# Patient Record
Sex: Female | Born: 1980 | Race: White | Hispanic: Yes | Marital: Single | Smoking: Never smoker
Health system: Southern US, Community
[De-identification: ages and names within clinical notes are randomized; demographics above are authoritative.]

## PROBLEM LIST (undated history)

## (undated) ENCOUNTER — Inpatient Hospital Stay (HOSPITAL_COMMUNITY): Payer: MEDICAID

---

## 2003-03-05 ENCOUNTER — Inpatient Hospital Stay (HOSPITAL_COMMUNITY): Admission: AD | Admit: 2003-03-05 | Discharge: 2003-03-06 | Payer: Self-pay | Admitting: Obstetrics & Gynecology

## 2003-04-09 ENCOUNTER — Inpatient Hospital Stay (HOSPITAL_COMMUNITY): Admission: AD | Admit: 2003-04-09 | Discharge: 2003-04-09 | Payer: Self-pay | Admitting: Obstetrics & Gynecology

## 2003-04-13 ENCOUNTER — Encounter: Admission: RE | Admit: 2003-04-13 | Discharge: 2003-04-13 | Payer: Self-pay | Admitting: *Deleted

## 2003-04-16 ENCOUNTER — Encounter: Admission: RE | Admit: 2003-04-16 | Discharge: 2003-04-16 | Payer: Self-pay | Admitting: *Deleted

## 2003-04-16 ENCOUNTER — Inpatient Hospital Stay (HOSPITAL_COMMUNITY): Admission: AD | Admit: 2003-04-16 | Discharge: 2003-04-16 | Payer: Self-pay | Admitting: *Deleted

## 2003-04-20 ENCOUNTER — Encounter: Admission: RE | Admit: 2003-04-20 | Discharge: 2003-04-20 | Payer: Self-pay | Admitting: *Deleted

## 2003-04-23 ENCOUNTER — Encounter: Admission: RE | Admit: 2003-04-23 | Discharge: 2003-04-23 | Payer: Self-pay | Admitting: *Deleted

## 2003-04-30 ENCOUNTER — Encounter: Admission: RE | Admit: 2003-04-30 | Discharge: 2003-04-30 | Payer: Self-pay | Admitting: *Deleted

## 2003-05-07 ENCOUNTER — Encounter: Admission: RE | Admit: 2003-05-07 | Discharge: 2003-05-07 | Payer: Self-pay | Admitting: Family Medicine

## 2003-05-08 ENCOUNTER — Inpatient Hospital Stay (HOSPITAL_COMMUNITY): Admission: AD | Admit: 2003-05-08 | Discharge: 2003-05-10 | Payer: Self-pay | Admitting: Specialist

## 2003-05-14 ENCOUNTER — Encounter: Admission: RE | Admit: 2003-05-14 | Discharge: 2003-05-14 | Payer: Self-pay | Admitting: *Deleted

## 2004-06-25 ENCOUNTER — Inpatient Hospital Stay (HOSPITAL_COMMUNITY): Admission: AD | Admit: 2004-06-25 | Discharge: 2004-06-26 | Payer: Self-pay | Admitting: *Deleted

## 2004-06-25 IMAGING — US US OB COMP LESS 14 WK
1 series · 18 of 28 positions shown · non-contrast
Comparison: none

CLINICAL DATA: 23-year-old pregnant female with vaginal bleeding and cramping.

[Series 1: us ob comp<14 wk · 18 of 48 slices shown]
[im 1/48]
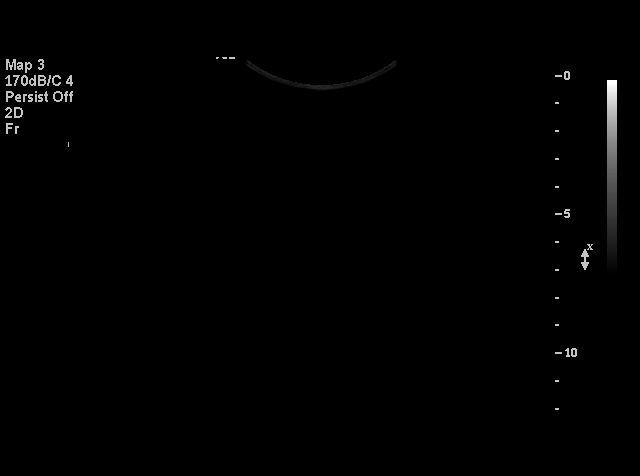
[im 4/48]
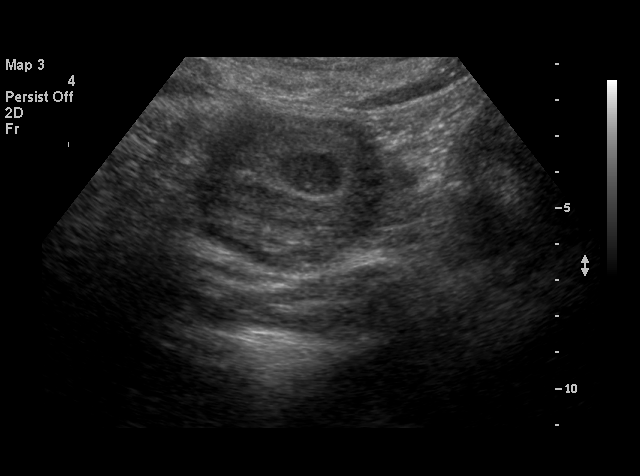
[im 6/48]
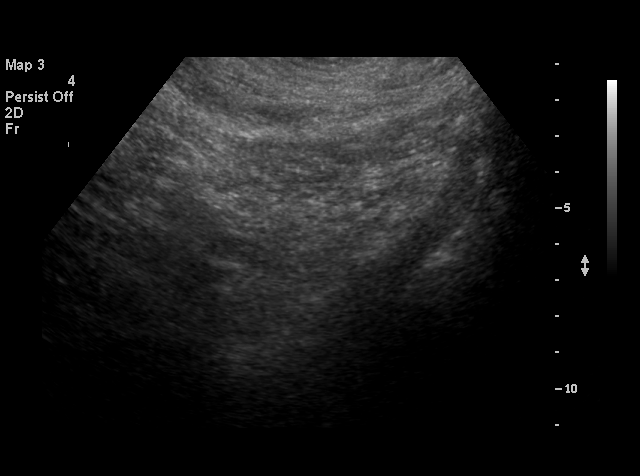
[im 9/48]
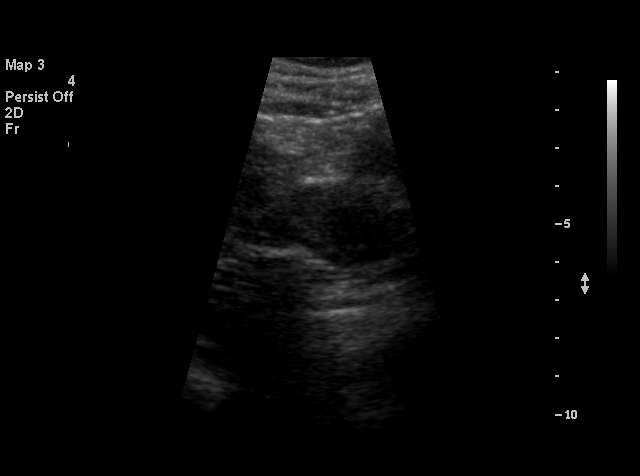
[im 13/48]
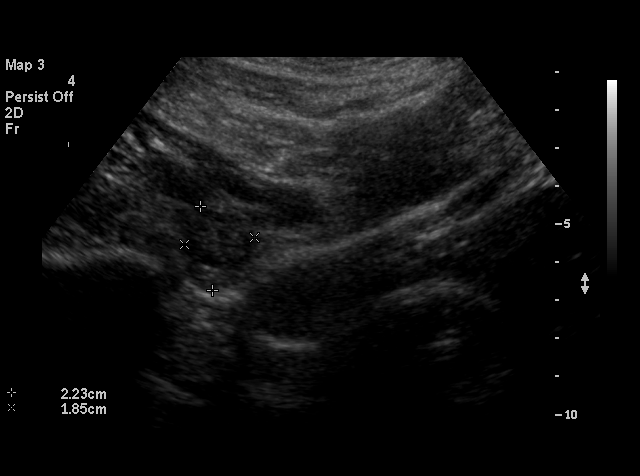
[im 14/48]
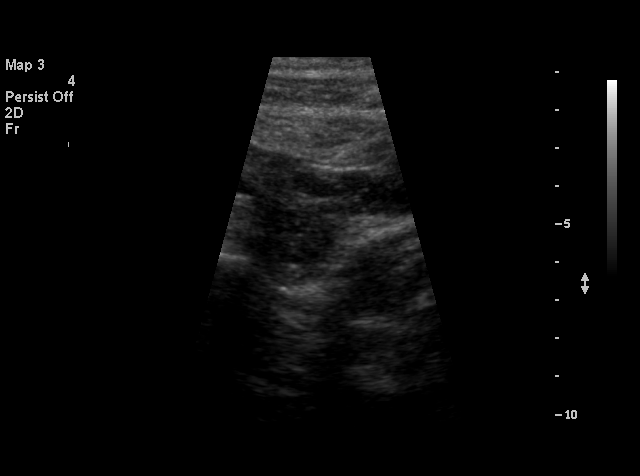
[im 18/48]
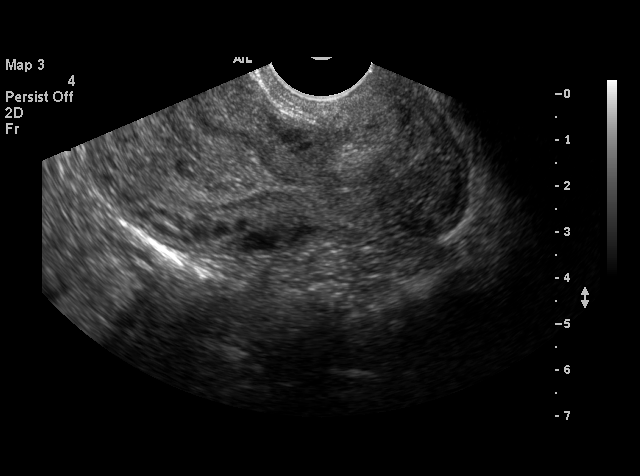
[im 20/48]
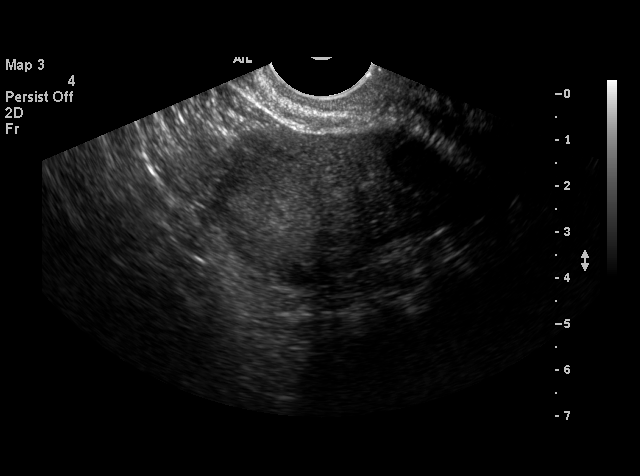
[im 23/48]
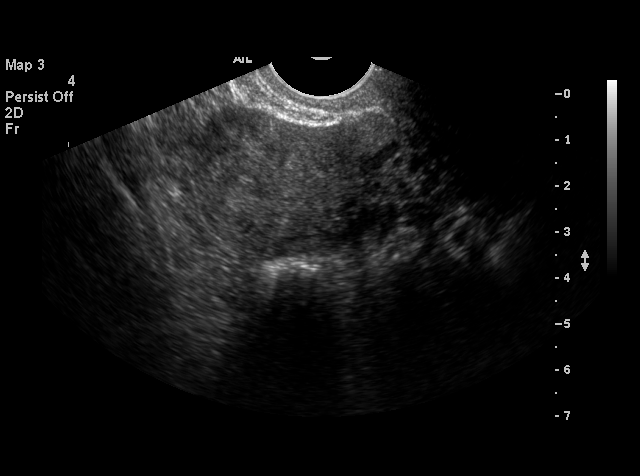
[im 25/48]
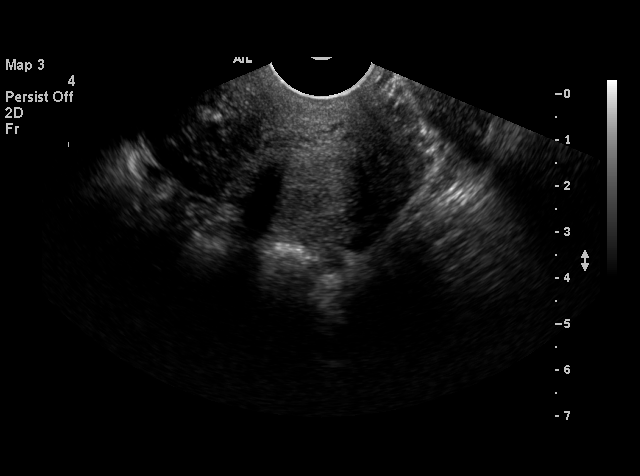
[im 28/48]
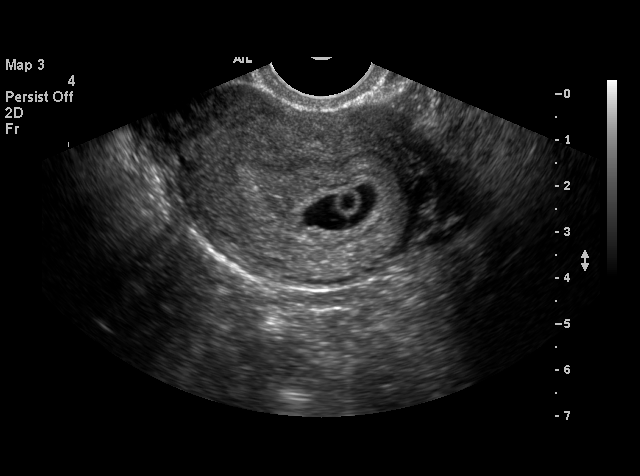
[im 30/48]
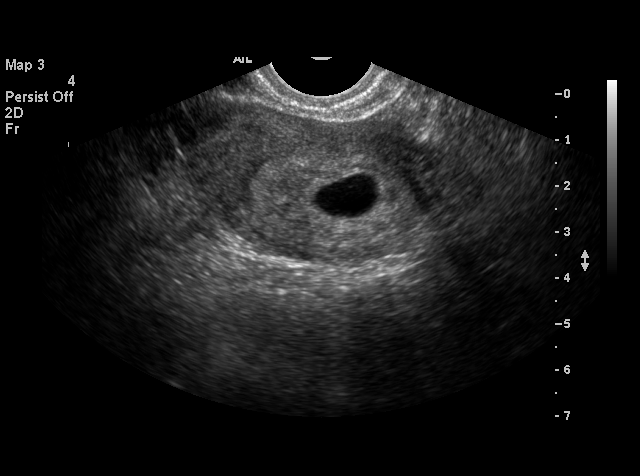
[im 34/48]
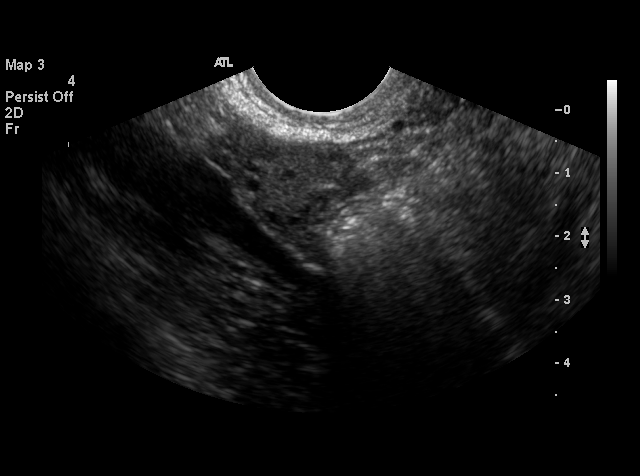
[im 37/48]
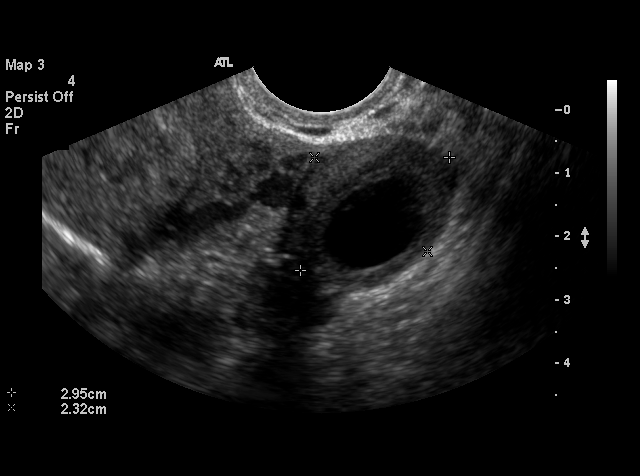
[im 39/48]
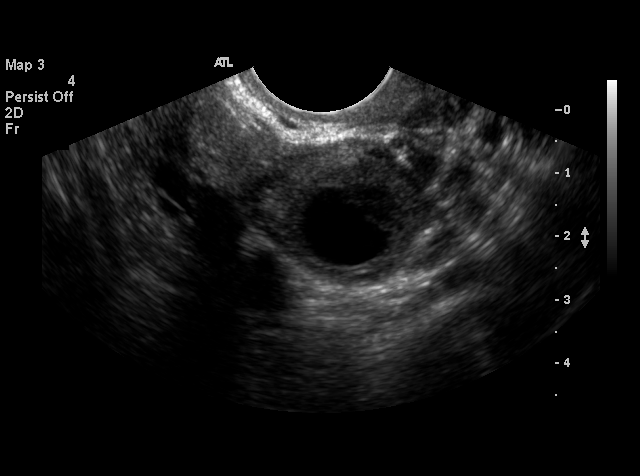
[im 42/48]
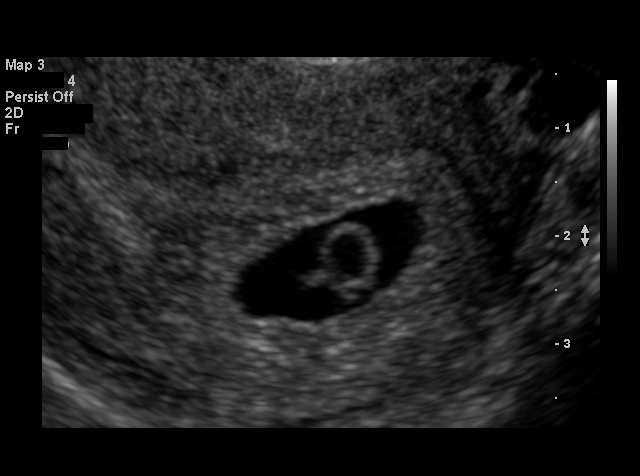
[im 44/48]
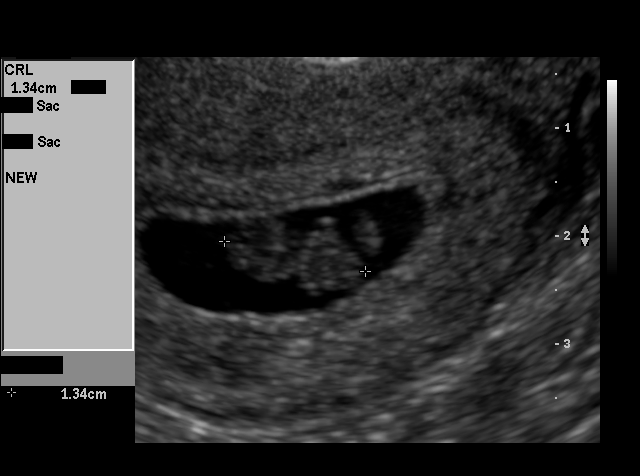
[im 48/48]
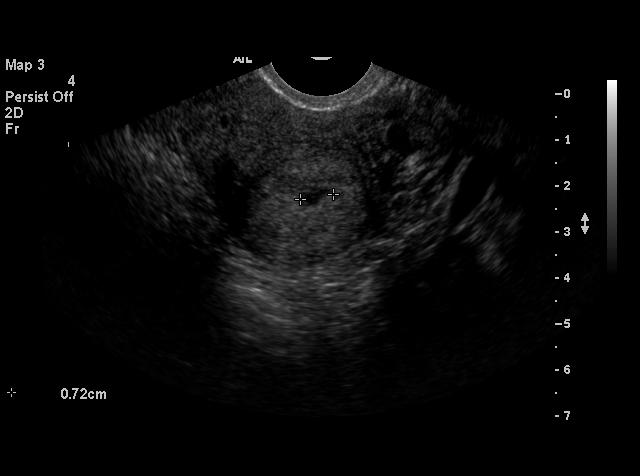

[18 of 28 positions shown; findings below may reference images not displayed]

OBSTETRICAL ULTRASOUND WITH TRANSVAGINAL:  

 Number of Fetuses:  1
 Heart Rate:  146
 Amniotic fluid:  normal
 CRL:  1.36 cm   `7 w 5 d

 Ultrasound EDC:  02/06/05

 Fetal anatomy could not be evaluated due to the early gestational age.  Yolk sac seen.

 MATERNAL FINDINGS
 Cervix not evaluated.  Corpus luteum left ovary.  Small subchorionic hemorrhage.
IMPRESSION: Single living intrauterine fetus estimated at 7 weeks and 5 days gestation with a heart rate of 146 bpm.  Small amount of subchorionic hemorrhage is noted.  Yolk sac is seen.

## 2004-07-27 ENCOUNTER — Inpatient Hospital Stay (HOSPITAL_COMMUNITY): Admission: AD | Admit: 2004-07-27 | Discharge: 2004-07-28 | Payer: Self-pay | Admitting: Family Medicine

## 2004-07-27 ENCOUNTER — Encounter (INDEPENDENT_AMBULATORY_CARE_PROVIDER_SITE_OTHER): Payer: Self-pay | Admitting: *Deleted

## 2004-08-09 ENCOUNTER — Ambulatory Visit: Payer: Self-pay | Admitting: Obstetrics and Gynecology

## 2004-10-25 ENCOUNTER — Other Ambulatory Visit: Admission: RE | Admit: 2004-10-25 | Discharge: 2004-10-25 | Payer: Self-pay | Admitting: Obstetrics and Gynecology

## 2004-10-25 ENCOUNTER — Ambulatory Visit: Payer: Self-pay | Admitting: Obstetrics and Gynecology

## 2004-10-27 ENCOUNTER — Inpatient Hospital Stay (HOSPITAL_COMMUNITY): Admission: AD | Admit: 2004-10-27 | Discharge: 2004-10-27 | Payer: Self-pay | Admitting: Obstetrics & Gynecology

## 2004-11-08 ENCOUNTER — Ambulatory Visit: Payer: Self-pay | Admitting: Family Medicine

## 2005-01-10 ENCOUNTER — Ambulatory Visit: Payer: Self-pay | Admitting: Family Medicine

## 2005-03-28 ENCOUNTER — Ambulatory Visit: Payer: Self-pay | Admitting: *Deleted

## 2005-05-09 ENCOUNTER — Ambulatory Visit: Payer: Self-pay | Admitting: Obstetrics & Gynecology

## 2005-06-05 ENCOUNTER — Emergency Department (HOSPITAL_COMMUNITY): Admission: EM | Admit: 2005-06-05 | Discharge: 2005-06-05 | Payer: Self-pay | Admitting: Emergency Medicine

## 2005-06-13 ENCOUNTER — Ambulatory Visit: Payer: Self-pay | Admitting: *Deleted

## 2005-08-30 ENCOUNTER — Ambulatory Visit: Payer: Self-pay | Admitting: Obstetrics and Gynecology

## 2005-11-07 ENCOUNTER — Emergency Department (HOSPITAL_COMMUNITY): Admission: EM | Admit: 2005-11-07 | Discharge: 2005-11-07 | Payer: Self-pay | Admitting: Family Medicine

## 2005-11-16 ENCOUNTER — Ambulatory Visit: Payer: Self-pay | Admitting: Obstetrics & Gynecology

## 2006-02-01 ENCOUNTER — Ambulatory Visit: Payer: Self-pay | Admitting: Obstetrics and Gynecology

## 2006-02-28 ENCOUNTER — Ambulatory Visit: Payer: Self-pay | Admitting: Obstetrics & Gynecology

## 2006-08-02 ENCOUNTER — Encounter: Payer: Self-pay | Admitting: Obstetrics and Gynecology

## 2006-08-02 ENCOUNTER — Ambulatory Visit: Payer: Self-pay | Admitting: Obstetrics and Gynecology

## 2008-10-08 ENCOUNTER — Emergency Department (HOSPITAL_COMMUNITY): Admission: EM | Admit: 2008-10-08 | Discharge: 2008-10-09 | Payer: Self-pay | Admitting: Emergency Medicine

## 2008-12-28 ENCOUNTER — Emergency Department (HOSPITAL_COMMUNITY): Admission: EM | Admit: 2008-12-28 | Discharge: 2008-12-28 | Payer: Self-pay | Admitting: Family Medicine

## 2010-08-26 ENCOUNTER — Inpatient Hospital Stay (HOSPITAL_COMMUNITY)
Admission: AD | Admit: 2010-08-26 | Discharge: 2010-08-26 | Payer: Self-pay | Source: Home / Self Care | Attending: Family Medicine | Admitting: Family Medicine

## 2010-08-26 IMAGING — US US OB TRANSVAGINAL MODIFY
1 series · 14 of 28 positions shown · non-contrast
Comparison: None.

CLINICAL DATA: Positive pregnancy test with vaginal bleeding.

OBSTETRIC <14 WK US AND TRANSVAGINAL OB US
TECHNIQUE: Both transabdominal and transvaginal ultrasound
examinations were performed for complete evaluation of the
gestation as well as the maternal uterus, adnexal regions, and
pelvic cul-de-sac.  Transvaginal technique was performed to assess
early pregnancy.

[Series 1: us ob comp less 14 wks · 0.21mm/px · 14 of 57 slices shown]
[im 3/57]
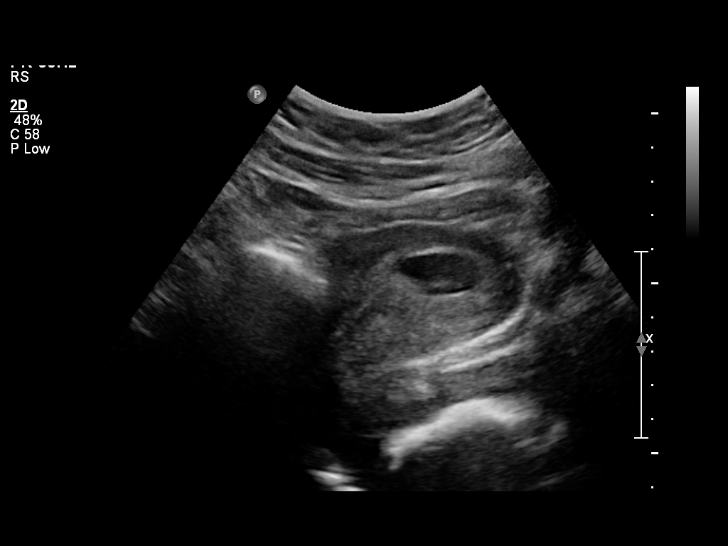
[im 7/57]
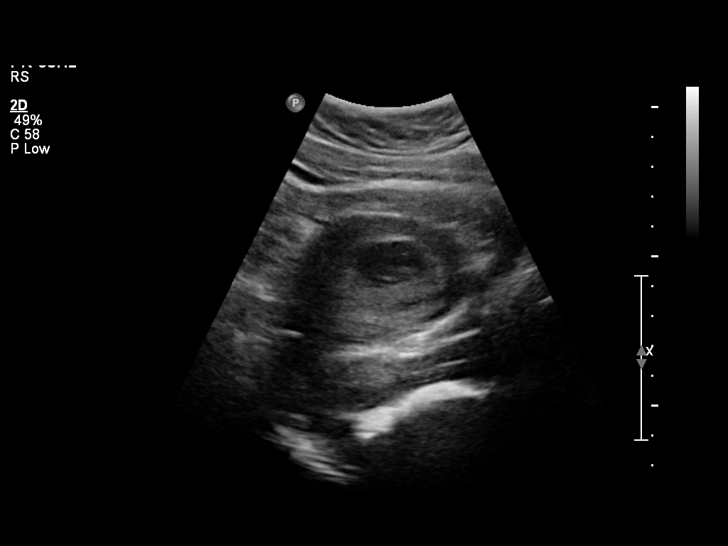
[im 11/57]
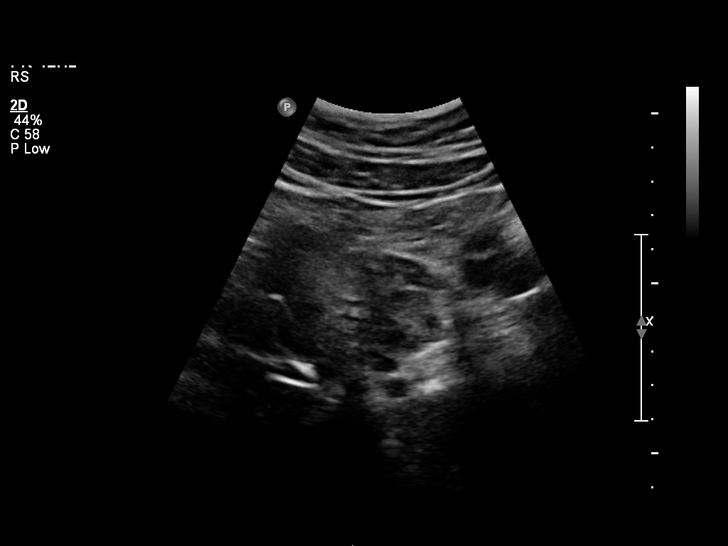
[im 15/57]
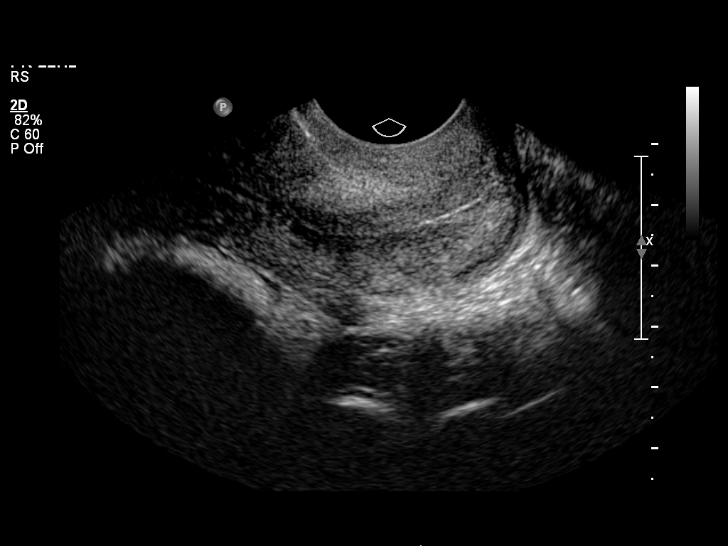
[im 19/57]
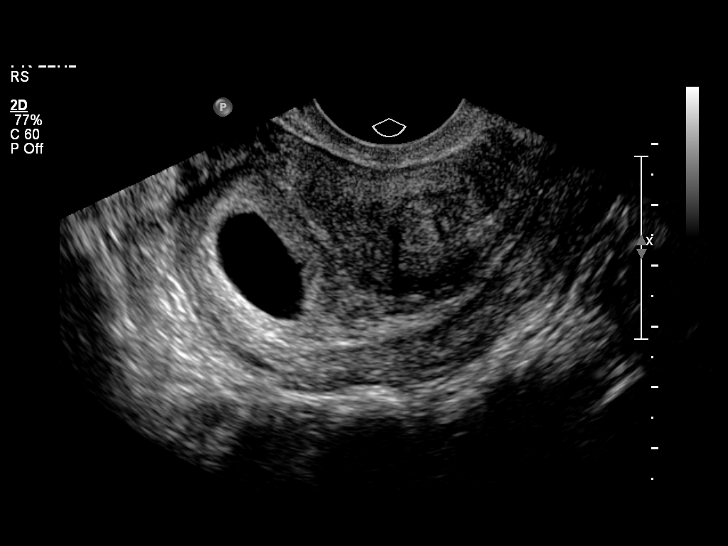
[im 23/57]
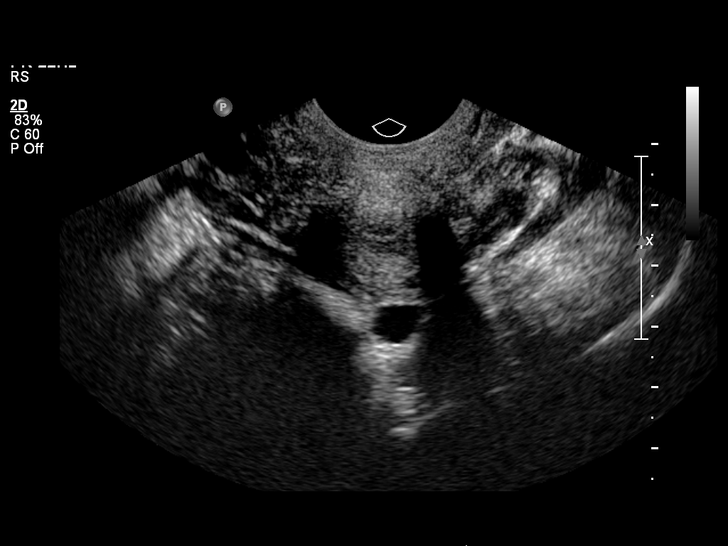
[im 27/57]
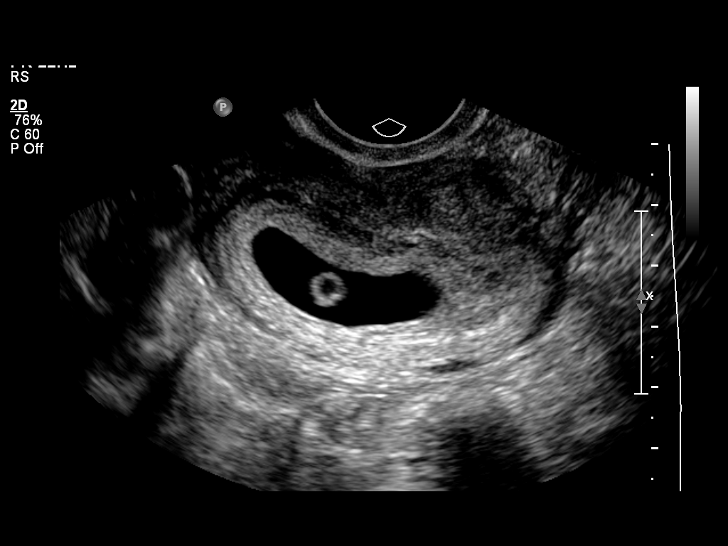
[im 32/57]
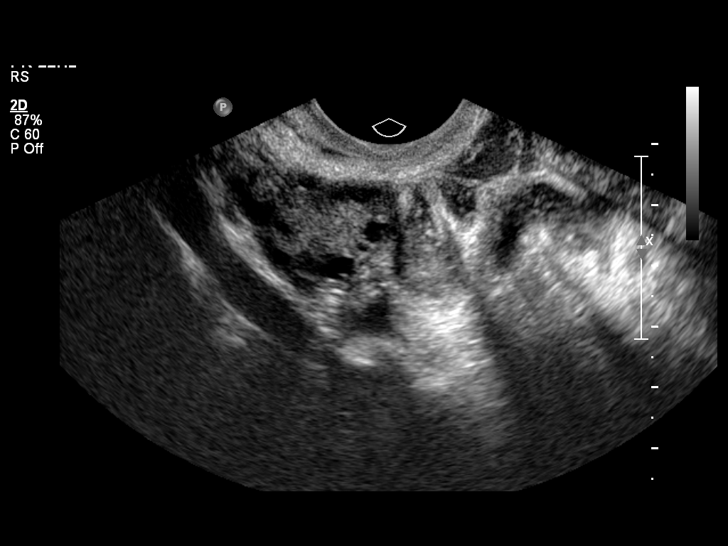
[im 36/57]
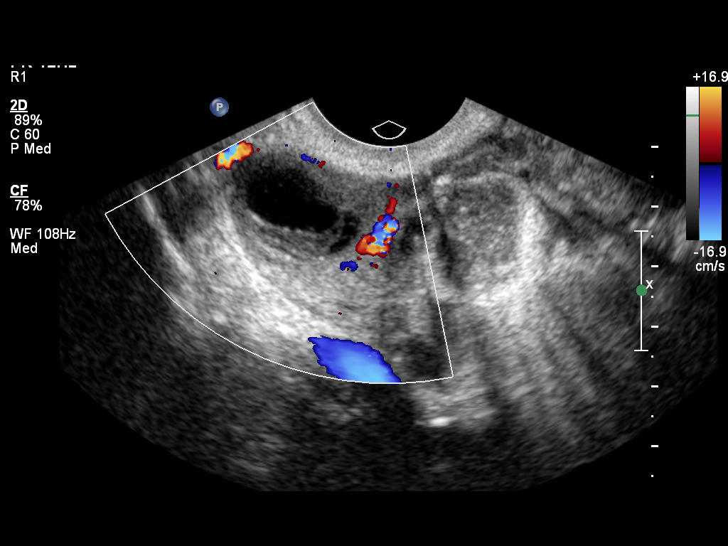
[im 40/57]
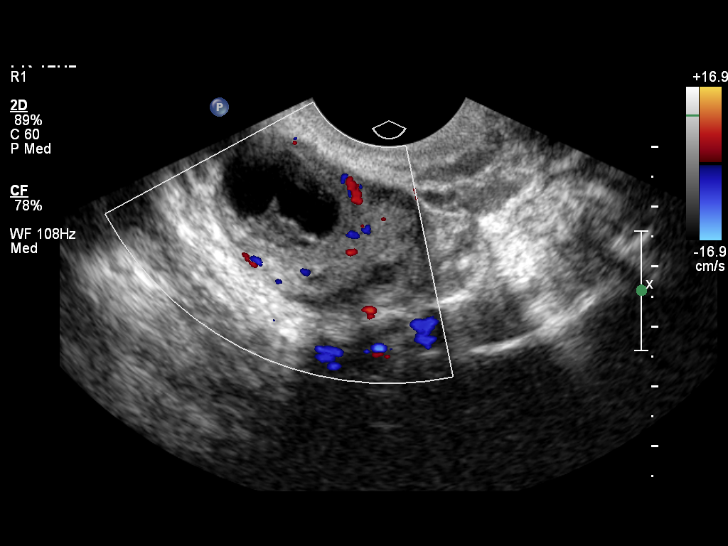
[im 44/57]
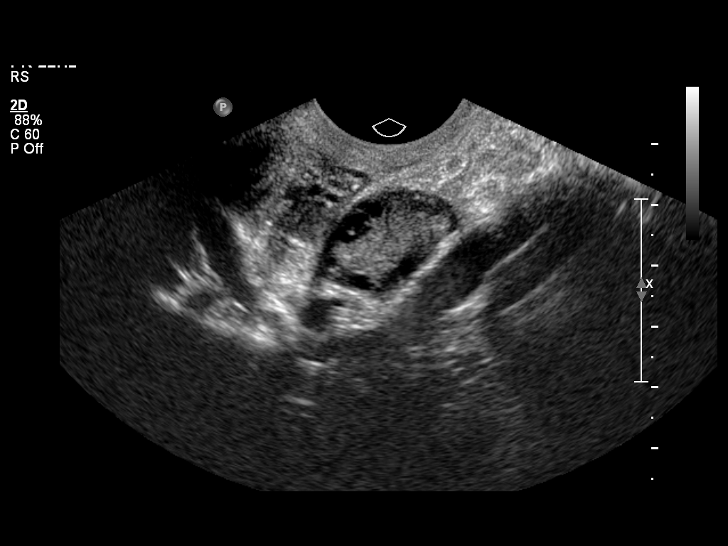
[im 48/57]
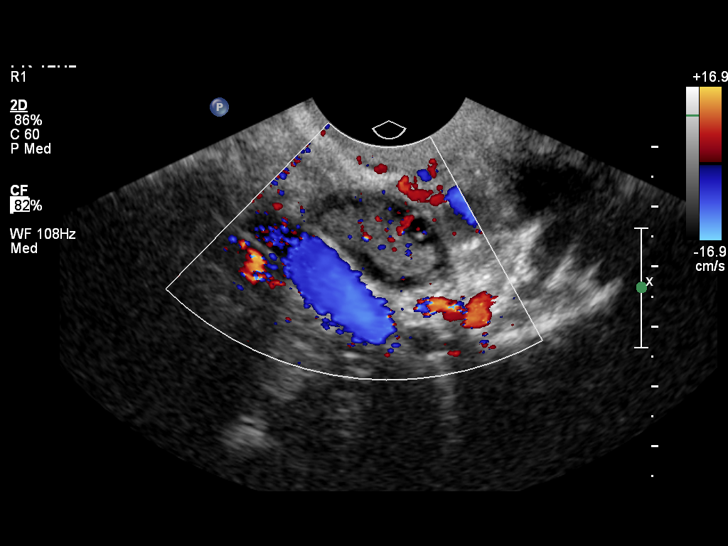
[im 52/57]
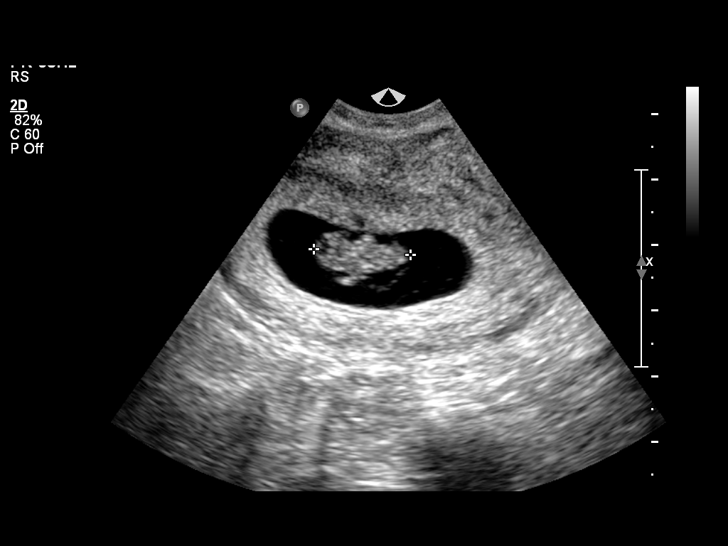
[im 57/57]
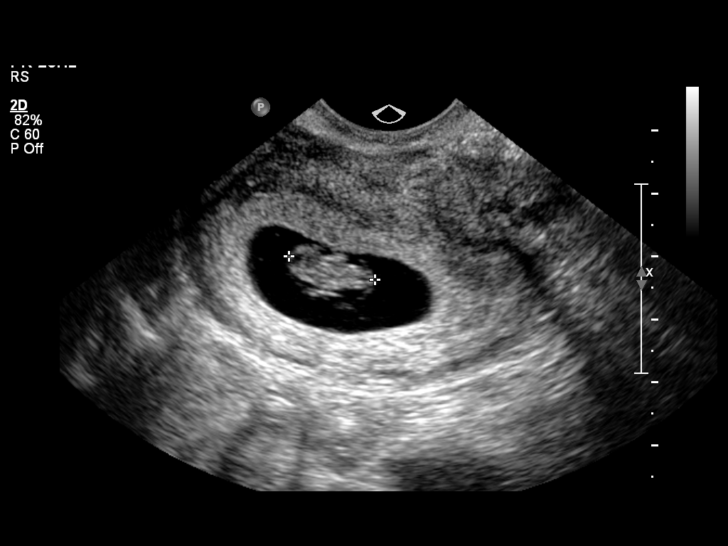

[14 of 28 positions shown; findings below may reference images not displayed]

Intrauterine gestational sac:  Visualized/normal in shape.
Yolk sac: Visualized
Embryo: Single
Cardiac Activity: Visualized
Heart Rate: 152 bpm

Crown-rump length is 14.4 mm which estimates a 7-week-8-day
gestational age.

US EDC: 04/08/2011

Maternal uterus/adnexae:
No evidence for subchorionic hemorrhage.  Maternal ovaries are
unremarkable with corpus luteum cyst identified on the right.  No
evidence for intraperitoneal free fluid.
IMPRESSION: Single living intrauterine gestation at estimated 7-week 6 day
gestational age by crown-rump length.  No evidence for subchorionic
hemorrhage at this time.

## 2010-11-28 LAB — URINALYSIS, ROUTINE W REFLEX MICROSCOPIC
Bilirubin Urine: NEGATIVE
Glucose, UA: NEGATIVE mg/dL
Ketones, ur: NEGATIVE mg/dL
Nitrite: NEGATIVE
Protein, ur: NEGATIVE mg/dL
Specific Gravity, Urine: 1.01 (ref 1.005–1.030)
Urobilinogen, UA: 0.2 mg/dL (ref 0.0–1.0)
pH: 6 (ref 5.0–8.0)

## 2010-11-28 LAB — URINE MICROSCOPIC-ADD ON

## 2010-11-28 LAB — CBC
HCT: 38.1 % (ref 36.0–46.0)
Hemoglobin: 13.3 g/dL (ref 12.0–15.0)
MCH: 32.6 pg (ref 26.0–34.0)
MCHC: 34.9 g/dL (ref 30.0–36.0)
MCV: 93.6 fL (ref 78.0–100.0)
Platelets: 237 10*3/uL (ref 150–400)
RBC: 4.07 MIL/uL (ref 3.87–5.11)
RDW: 12.8 % (ref 11.5–15.5)
WBC: 9.5 10*3/uL (ref 4.0–10.5)

## 2010-11-28 LAB — POCT PREGNANCY, URINE: Preg Test, Ur: POSITIVE

## 2010-11-28 LAB — HCG, QUANTITATIVE, PREGNANCY: hCG, Beta Chain, Quant, S: 69136 m[IU]/mL — ABNORMAL HIGH (ref <5)

## 2010-11-28 LAB — ABO/RH: ABO/RH(D): O POS

## 2010-12-28 LAB — POCT URINALYSIS DIP (DEVICE)
Bilirubin Urine: NEGATIVE
Glucose, UA: NEGATIVE mg/dL
Hgb urine dipstick: NEGATIVE
Ketones, ur: NEGATIVE mg/dL
Nitrite: NEGATIVE
Protein, ur: NEGATIVE mg/dL
Specific Gravity, Urine: 1.025 (ref 1.005–1.030)
Urobilinogen, UA: 0.2 mg/dL (ref 0.0–1.0)
pH: 5 (ref 5.0–8.0)

## 2010-12-28 LAB — HERPES SIMPLEX VIRUS CULTURE: Culture: NOT DETECTED

## 2010-12-28 LAB — POCT PREGNANCY, URINE: Preg Test, Ur: NEGATIVE

## 2011-01-02 LAB — DIFFERENTIAL
Basophils Absolute: 0 10*3/uL (ref 0.0–0.1)
Basophils Relative: 0 % (ref 0–1)
Eosinophils Absolute: 0.1 10*3/uL (ref 0.0–0.7)
Eosinophils Relative: 1 % (ref 0–5)
Lymphocytes Relative: 17 % (ref 12–46)
Lymphs Abs: 1.7 10*3/uL (ref 0.7–4.0)
Monocytes Absolute: 0.5 10*3/uL (ref 0.1–1.0)
Monocytes Relative: 5 % (ref 3–12)
Neutro Abs: 7.3 10*3/uL (ref 1.7–7.7)
Neutrophils Relative %: 76 % (ref 43–77)

## 2011-01-02 LAB — COMPREHENSIVE METABOLIC PANEL
ALT: 24 U/L (ref 0–35)
AST: 19 U/L (ref 0–37)
Albumin: 4.4 g/dL (ref 3.5–5.2)
Alkaline Phosphatase: 79 U/L (ref 39–117)
BUN: 6 mg/dL (ref 6–23)
CO2: 26 mEq/L (ref 19–32)
Calcium: 9.6 mg/dL (ref 8.4–10.5)
Chloride: 103 mEq/L (ref 96–112)
Creatinine, Ser: 0.46 mg/dL (ref 0.4–1.2)
GFR calc Af Amer: >60 mL/min (ref >60)
GFR calc non Af Amer: >60 mL/min (ref >60)
Glucose, Bld: 86 mg/dL (ref 70–99)
Potassium: 3.6 mEq/L (ref 3.5–5.1)
Sodium: 136 mEq/L (ref 135–145)
Total Bilirubin: 1 mg/dL (ref 0.3–1.2)
Total Protein: 7.4 g/dL (ref 6.0–8.3)

## 2011-01-02 LAB — URINALYSIS, ROUTINE W REFLEX MICROSCOPIC
Bilirubin Urine: NEGATIVE
Glucose, UA: NEGATIVE mg/dL
Hgb urine dipstick: NEGATIVE
Ketones, ur: NEGATIVE mg/dL
Nitrite: NEGATIVE
Protein, ur: NEGATIVE mg/dL
Specific Gravity, Urine: 1.014 (ref 1.005–1.030)
Urobilinogen, UA: 0.2 mg/dL (ref 0.0–1.0)
pH: 5.5 (ref 5.0–8.0)

## 2011-01-02 LAB — POCT PREGNANCY, URINE: Preg Test, Ur: NEGATIVE

## 2011-01-02 LAB — CBC
HCT: 44.7 % (ref 36.0–46.0)
Hemoglobin: 14.8 g/dL (ref 12.0–15.0)
MCHC: 33.2 g/dL (ref 30.0–36.0)
MCV: 94 fL (ref 78.0–100.0)
Platelets: 211 10*3/uL (ref 150–400)
RBC: 4.76 MIL/uL (ref 3.87–5.11)
RDW: 12.8 % (ref 11.5–15.5)
WBC: 9.6 10*3/uL (ref 4.0–10.5)

## 2011-02-03 NOTE — Group Therapy Note (Signed)
NAMELOUELLA, MEDAGLIA NO.:  [id number]   MEDICAL RECORD NO.:  [id number]          PATIENT TYPE:  WOC   LOCATION:  [facility]                   FACILITY:  [facility]   PHYSICIAN:  Argentina Donovan, MD        DATE OF BIRTH:  1981-04-25   DATE OF SERVICE:  08/09/2004                                    CLINIC NOTE   CHIEF COMPLAINT:  Follow up inpatient visit for miscarriage.   HISTORY OF PRESENT ILLNESS:  Diane Huber is a 30 year old Hispanic  female who presented to the MAU on July 27, 2004 and was diagnosed with  an impending SAB.  She was given Cytotec and told to return the next day.  Her SAB was complete and she was given Motrin and doxycycline.  She said she  did quite well and only had spotting for about 3 more days.  She has not had  any abdominal pain, spotting, or fever since that time.  She does want to  start birth control - Depo - today, and she has not had a Pap smear in 1  year.   OBJECTIVE:  VITAL SIGNS:  Per chart.  GENERAL:  No acute distress, slightly overweight 30 year old female, appears  stated age.  ABDOMEN:  Soft, nontender.  PELVIC:  External genitalia appears normal with no lesions.  Vagina pink and  rugated.  Cervix appears closed with blood-tinged clear mucus.   LABORATORY DATA:  UPT is negative today.   ASSESSMENT AND PLAN:  1.  Complete spontaneous abortion.  The patient seems to be doing quite well      and has completed her SAB.  2.  Contraceptive counseling.  Would like to start Depo.  We will give her      an injection today since her UPT was negative.  3.  Did Pap smear today since she was due for her yearly Pap smear.      Otherwise, she can follow up in 12 months.      CM/MEDQ  D:  08/09/2004  T:  08/09/2004  Job:  [id number]

## 2011-02-03 NOTE — Group Therapy Note (Signed)
NAMEZEAH, GERMANO NO.:  [id number]   MEDICAL RECORD NO.:  [id number]          PATIENT TYPE:  WOC   LOCATION:  [facility]                   FACILITY:  [facility]   PHYSICIAN:  Argentina Donovan, MD        DATE OF BIRTH:  01/21/1981   DATE OF SERVICE:  02/01/2006                                    CLINIC NOTE   The patient is a 30 year old gravida 1 para 1-0-0-1 who has been on Depo-  Provera, and as a result gained a significant amount of weight.  She went  from 112 pounds to 143 pounds.  I wanted her to just get off of the Depo-  Provera and discuss other types of birth control.  We discussed in detail  the birth control pill and the patch, the intrauterine device, both the 5  and 10-year one, the Nuvo Ring, and the Implanon.  The patient decided she  would like to try the Mirena.  We are going to apply to the [facility] program for  her and hopefully get that and be able to put it in.           ______________________________  Argentina Donovan, MD     PR/MEDQ  D:  02/01/2006  T:  02/02/2006  Job:  [id number]

## 2011-02-03 NOTE — Group Therapy Note (Signed)
[name], BEARSE NO.:  [id number]   MEDICAL RECORD NO.:  [id number]          PATIENT TYPE:  WOC   LOCATION:  [facility]                   [facility]ACILITY:  [facility]   PHYSICIAN:  Elsie Lincoln, MD      DATE OF BIRTH:  [birth year]   DATE OF SERVICE:  02/28/2006                                    CLINIC NOTE   The patient is 30 year old female who presented today for Mirena IUD.  She  did not qualify from the [facility] because she makes too much money.  She does not qualify for __________ and because there is no foundation to  help her she does not want the Deaver ring and decided or oral  contraception.  We went over how to take them, side effects, benefits, and  what to do if she gets the pills. She was given a sample of Loestrin Fe #24  and [facility] much  cheaper than she can here.  Her Pap smear is up to date, it was last on  May 09, 2005 and was negative.  She will most likely get her Pap smear [facility] as well.   The patient can follow up p.r.n. but most likely will be seen [facility] the [facility] from now on.           ______________________________  Elsie Lincoln, MD     KL/MEDQ  D:  02/28/2006  T:  02/28/2006  Job:  [id number]

## 2011-02-03 NOTE — Discharge Summary (Signed)
   NAMEGENNIE, Diane Huber                     ACCOUNT NO.:  [id number]   MEDICAL RECORD NO.:  [id number]                   PATIENT TYPE:  INP   LOCATION:  9152                                 [facility]   PHYSICIAN:  Gerrit Friends. Aldona Bar, M.D.                DATE OF BIRTH:  July 31, 1981   DATE OF ADMISSION:  03/05/2003  DATE OF DISCHARGE:  03/06/2003                                 DISCHARGE SUMMARY   HISTORY OF PRESENT ILLNESS:  This is a 30 year old gravida 1, para 0 who  presented on March 05, 2003 for abdominal pain and a white odorous discharge  at 30-3/7 weeks. Her pain had begun the evening before at 8:00 p.m. She had  pulsing pain in the groin and vaginal swelling and pain in the epigastric  region. Vaginal discharge with a foul odor. Her source of her prenatal care  was at [facility] with the onset of her care being at 23 weeks. She denied  any burning with urination or signs and symptoms of urinary tract infection.   OBSTETRICAL/GYNECOLOGICAL HISTORY:  She denied any previous STD's or vaginal  infections.   PAST MEDICAL HISTORY:  Denies. Unremarkable.   PAST SURGICAL HISTORY:  No surgical history.   FAMILY HISTORY:  Unremarkable.   LABORATORY DATA:  She is 0+, antibody negative, and Rubella immune.   HOSPITAL COURSE:  At that time, she was admitted. GC and Chlamydia and GBS  cultures were done. Wet prep showed positive clue cells, so a diagnosis at  that time was made of a 30 year old, G1, P0 at 30-3/7 weeks with BV and  possibly preterm labor. She was given Terbutaline for having contractions on  the monitor that were every 5 minutes apart at 30 weeks, and she was also  hydrated. She was admitted for preterm labor. She was kept for 23 hours and  she was discharged home. Her fetal fibronectin test that was done came back  negative. Her vital signs were all stable. She was afebrile. Baby's heart  rate during that time was 150's with accelerations and no  decelerations.   DISPOSITION:  She was discharged home with instructions to have pelvic rest,  bedrest with preterm labor precautions, and to followup in the high risk  clinic that following week, and she did have an appointment.     Deirdre Christy Gentles, C.N.M.                       Gerrit Friends. Aldona Bar, M.D.    DP/MEDQ  D:  04/28/2003  T:  04/28/2003  Job:  [id number]

## 2011-07-08 ENCOUNTER — Encounter (HOSPITAL_COMMUNITY): Payer: Self-pay

## 2011-07-08 ENCOUNTER — Inpatient Hospital Stay (HOSPITAL_COMMUNITY)
Admission: AD | Admit: 2011-07-08 | Discharge: 2011-07-08 | Disposition: A | Discharge status: Home / Self Care | Payer: Self-pay | Source: Ambulatory Visit | Attending: Obstetrics | Admitting: Obstetrics

## 2011-07-08 DIAGNOSIS — O26899 Other specified pregnancy related conditions, unspecified trimester: Secondary | ICD-10-CM

## 2011-07-08 DIAGNOSIS — B373 Candidiasis of vulva and vagina: Secondary | ICD-10-CM

## 2011-07-08 DIAGNOSIS — B3731 Acute candidiasis of vulva and vagina: Secondary | ICD-10-CM | POA: Insufficient documentation

## 2011-07-08 DIAGNOSIS — O2390 Unspecified genitourinary tract infection in pregnancy, unspecified trimester: Secondary | ICD-10-CM | POA: Insufficient documentation

## 2011-07-08 DIAGNOSIS — R109 Unspecified abdominal pain: Secondary | ICD-10-CM | POA: Insufficient documentation

## 2011-07-08 LAB — URINALYSIS, ROUTINE W REFLEX MICROSCOPIC
Bilirubin Urine: NEGATIVE
Glucose, UA: NEGATIVE mg/dL
Hgb urine dipstick: NEGATIVE
Ketones, ur: NEGATIVE mg/dL
Leukocytes, UA: NEGATIVE
Nitrite: NEGATIVE
Protein, ur: NEGATIVE mg/dL
Specific Gravity, Urine: 1.02 (ref 1.005–1.030)
Urobilinogen, UA: 0.2 mg/dL (ref 0.0–1.0)
pH: 6 (ref 5.0–8.0)

## 2011-07-08 LAB — WET PREP, GENITAL
Clue Cells Wet Prep HPF POC: NONE SEEN
Trich, Wet Prep: NONE SEEN

## 2011-07-08 MED ORDER — FLUCONAZOLE 150 MG PO TABS: 150.0000 mg | ORAL_TABLET | Freq: Once | ORAL | Status: AC

## 2011-07-08 MED ORDER — ACETAMINOPHEN 325 MG PO TABS
650.0000 mg | ORAL_TABLET | Freq: Once | ORAL | Status: AC
  Administered 2011-07-08: 650 mg via ORAL
  Filled 2011-07-08: qty 2

## 2011-07-08 MED ORDER — GI COCKTAIL ~~LOC~~
30.0000 mL | Freq: Once | ORAL | Status: AC
  Administered 2011-07-08: 30 mL via ORAL
  Filled 2011-07-08: qty 30

## 2011-07-08 NOTE — Progress Notes (Signed)
Patient is here with c/o lower to mid abdominal pain that started last night at 2100pm. She states that has gotten worse where she is unable to sleep. She denies any vaginal bleeding, lof or discharge.

## 2011-07-08 NOTE — ED Provider Notes (Signed)
History     Chief Complaint  Patient presents with  . Abdominal Pain   HPI Pt isG4P1 SAB1TAB1 [redacted]w[redacted]d pregnant and presents with lower abdominal pain.  The pain started suddenly when she was eating PIZZA around 9 pm.  Her pain is located in her lower abdomen- the pain does not hurt when she walks- it is worse when she is lying down.  She has not had nausea.  She has had some heartburn but no nausea.  She denies any fever.  She had a last bowel movement yesterday.  She has not taken anything for the pain.  She last intercourse was at 11 pm and she did not have pain with intercourse.  The pain comes and goes maybe every 10 minutes lasting maybe a minute.    History reviewed. No pertinent past medical history.  History reviewed. No pertinent past surgical history.  History reviewed. No pertinent family history.  History  Substance Use Topics  . Smoking status: Never Smoker   . Smokeless tobacco: Not on file  . Alcohol Use: No    Allergies: No Known Allergies  No prescriptions prior to admission    Review of Systems  Constitutional: Negative for fever and chills.  Gastrointestinal: Positive for heartburn, nausea and abdominal pain. Negative for vomiting, diarrhea and constipation.  Genitourinary: Negative for dysuria.  Neurological: Negative for headaches.   Physical Exam   Blood pressure 114/77, pulse 78, temperature 97.5 F (36.4 C), temperature source Oral, resp. rate 16, weight 147 lb 8 oz (66.906 kg), SpO2 99.00%.  Physical Exam  Constitutional: She is oriented to person, place, and time. She appears well-developed and well-nourished.  HENT:  Head: Normocephalic.  Eyes: Pupils are equal, round, and reactive to light.  Neck: Normal range of motion. Neck supple.  Cardiovascular: Normal rate.   Respiratory: Effort normal.  GI: Soft. There is tenderness. There is no rebound and no guarding.       FHR 152  Genitourinary:       Mod amount of white discharge in vault; cervix  clean slightly tender; uterus mildly tender  Musculoskeletal: Normal range of motion.  Neurological: She is alert and oriented to person, place, and time.  Skin: Skin is warm and dry.  Psychiatric: She has a normal mood and affect.    MAU Course  Procedures GI cocktail given to pt- made nauseated- no relief with pain Wet prep- yeast Urinalysis- normal GC/chlamydia- pending Tylenol 650 mg PO given- initially pt felt pain relief- then pain return intermittenty   Assessment and Plan  Abdominal pain in pregnancy ?round ligament pain Yeast vaginitis  Kikuye Korenek 07/08/2011, 4:56 AM

## 2011-07-10 LAB — GC/CHLAMYDIA PROBE AMP, GENITAL: GC Probe Amp, Genital: NEGATIVE

## 2011-08-11 ENCOUNTER — Encounter (HOSPITAL_COMMUNITY): Payer: Self-pay | Admitting: *Deleted

## 2011-08-11 ENCOUNTER — Inpatient Hospital Stay (HOSPITAL_COMMUNITY)
Admission: AD | Admit: 2011-08-11 | Discharge: 2011-08-12 | Disposition: A | Discharge status: Home / Self Care | Payer: Self-pay | Source: Ambulatory Visit | Attending: Obstetrics | Admitting: Obstetrics

## 2011-08-11 DIAGNOSIS — O99891 Other specified diseases and conditions complicating pregnancy: Secondary | ICD-10-CM | POA: Insufficient documentation

## 2011-08-11 DIAGNOSIS — B9789 Other viral agents as the cause of diseases classified elsewhere: Secondary | ICD-10-CM | POA: Insufficient documentation

## 2011-08-11 DIAGNOSIS — B349 Viral infection, unspecified: Secondary | ICD-10-CM

## 2011-08-11 LAB — URINALYSIS, ROUTINE W REFLEX MICROSCOPIC
Glucose, UA: NEGATIVE mg/dL
Hgb urine dipstick: NEGATIVE
Ketones, ur: 15 mg/dL — AB
Protein, ur: NEGATIVE mg/dL
Urobilinogen, UA: 0.2 mg/dL (ref 0.0–1.0)

## 2011-08-11 NOTE — ED Provider Notes (Signed)
History     Chief Complaint  Patient presents with  . Cough  . Sore Throat  . Fever   HPI  Pt reports starting with cough and mucus three days ago; reports also having a fever.  Pt has overall body aches.  Denies vaginal bleeding or leaking of fluid.  +exposure to ill family members.     History reviewed. No pertinent past medical history.  History reviewed. No pertinent past surgical history.  History reviewed. No pertinent family history.  History  Substance Use Topics  . Smoking status: Never Smoker   . Smokeless tobacco: Never Used  . Alcohol Use: No    Allergies: No Known Allergies  Prescriptions prior to admission  Medication Sig Dispense Refill  . acetaminophen (TYLENOL) 500 MG tablet Take 500 mg by mouth every 6 (six) hours as needed. Takes for pain       . prenatal vitamin w/FE, FA (PRENATAL 1 + 1) 27-1 MG TABS Take 1 tablet by mouth daily.        . Pseudoephedrine-Naproxen Na (SUDAFED PRESSURE+PAIN 12 HR PO) Take 1 tablet by mouth daily as needed. For pain         Review of Systems  Constitutional: Positive for fever, chills and malaise/fatigue. Negative for weight loss.  HENT: Positive for nosebleeds, congestion and sore throat.   Respiratory: Positive for cough and sputum production. Negative for hemoptysis, shortness of breath and wheezing.   Musculoskeletal: Positive for myalgias.  Neurological: Positive for headaches.   Physical Exam   Blood pressure 115/77, pulse 108, temperature 98.9 F (37.2 C), temperature source Oral, resp. rate 20, height 4\' 11"  (1.499 m), weight 67.359 kg (148 lb 8 oz), SpO2 99.00%.  Physical Exam  Constitutional: She is oriented to person, place, and time. She appears well-developed and well-nourished.       Ill appearing  HENT:  Head: Normocephalic.  Mouth/Throat: Oropharynx is clear and moist.  Eyes: Conjunctivae are normal.  Neck: Normal range of motion. Neck supple.  Cardiovascular: Normal rate, regular rhythm and  normal heart sounds.   Respiratory: Effort normal and breath sounds normal. No respiratory distress. She has no wheezes. She has no rales.  GI: Soft. There is no tenderness.  Genitourinary: No bleeding around the vagina. Vaginal discharge (mucusy) found.       Cervix - closed  Neurological: She is alert and oriented to person, place, and time.  Skin: Skin is warm and dry.    MAU Course  Procedures  PCR Influenza - pend Tylenol - 1 gram Consult with Dr. Gaynell Face > encourage to increase fluids, Robitussin, RX ZPak Follow-up on Monday  Assessment and Plan  Viral Syndrome  Plan: DC to home RX ZPak Increase fluids Tylenol & Robitussin  [facility] 08/11/2011, 11:22 PM

## 2011-08-11 NOTE — Progress Notes (Signed)
Pt states, " Last Sunday I started with a runny nose and then on Tuesday I started with a cough with yellow phelm  and fever that was coming and going. Wednesday my throat became sore.TodayI had a nosebleed, like blood mixed with mucous."

## 2011-08-12 LAB — INFLUENZA PANEL BY PCR (TYPE A & B): Influenza A By PCR: NEGATIVE

## 2011-08-12 MED ORDER — AZITHROMYCIN 250 MG PO TABS: ORAL_TABLET | ORAL | Status: DC

## 2011-08-12 MED ORDER — ACETAMINOPHEN 500 MG PO TABS
1000.0000 mg | ORAL_TABLET | Freq: Once | ORAL | Status: AC
  Administered 2011-08-12: 1000 mg via ORAL
  Filled 2011-08-12: qty 2

## 2011-08-12 MED ORDER — AZITHROMYCIN 250 MG PO TABS: ORAL_TABLET | ORAL | Status: AC

## 2011-08-12 NOTE — Progress Notes (Signed)
Written and verbal d/c instructions given and understanding voiced. 

## 2011-09-19 NOTE — L&D Delivery Note (Deleted)
Delivery Note At 7:54 PM a viable female was delivered via C-Section, Low Transverse (Presentation: Vertex ;  ).  APGAR: 4, 8; weight 2440 grams.   Placenta status: Intact, Manual removal.  Cord: 3 vessels with the following complications: .  Cord pH: 7.33  Anesthesia: General  Episiotomy: None Lacerations: None  Est. Blood Loss (mL):  Mom to AICU.  Baby to NICU.  Braidon Chermak A 11/05/2011, 8:39 PM   Cesarean Section Procedure Note  Indications: severe preeclampsia.  HELLP Syndrome.  Unfavorable cervix.  Protracted latent phase of labor.  Pre-operative Diagnosis: 34 week 4 day pregnancy.  Post-operative Diagnosis: same  Surgeon: Marquan Vokes A   Assistants: SURG. Tech.  Anesthesia: General endotracheal anesthesia  ASA Class: 3   Procedure Details   The patient was seen in the Holding Room. The risks, benefits, complications, treatment options, and expected outcomes were discussed with the patient.  The patient concurred with the proposed plan, giving informed consent.  The site of surgery properly noted/marked. The patient was taken to Operating Room # 1, identified as Diane Huber and the procedure verified as C-Section Delivery. A Time Out was held and the above information confirmed.  After induction of anesthesia, the patient was draped and prepped in the usual sterile manner. A Pfannenstiel incision was made and carried down through the subcutaneous tissue to the fascia. Fascial incision was made and extended transversely. The fascia was separated from the underlying rectus tissue superiorly and inferiorly. The peritoneum was identified and entered. Peritoneal incision was extended longitudinally. The utero-vesical peritoneal reflection was incised transversely and the bladder flap was bluntly freed from the lower uterine segment. A low transverse uterine incision was made. Delivered from cephalic presentation with vacuum assistance because of hyper extension,   was a 2440 gram Female,  with Apgar scores of 4 at one minute and 8 at five minutes. After the umbilical cord was clamped and cut cord blood was obtained for evaluation. The placenta was removed intact and appeared normal. The uterine outline, tubes and ovaries appeared normal. The uterine incision was closed with running locked sutures of 0 Monocryl. Hemostasis was observed. Lavage was carried out until clear.  The peritoneum closed with 2-0 Monocryl. The fascia was then reapproximated with running sutures of 0 Vicryl. The skin was reapproximated with Staples.  Instrument, sponge, and needle counts were correct prior the abdominal closure and at the conclusion of the case.   Findings: Viable female.  Estimated Blood Loss:          Drains: Foley to gravity.         Total IV Fluids:          Specimens: Placenta          Implants: none         Complications:  None; patient tolerated the procedure well.         Disposition: PACU - hemodynamically stable.         Condition: stable  Attending Attestation: I was present and scrubbed for the entire procedure.

## 2011-11-05 ENCOUNTER — Inpatient Hospital Stay (HOSPITAL_COMMUNITY): Payer: Medicaid Other

## 2011-11-05 ENCOUNTER — Encounter (HOSPITAL_COMMUNITY): Payer: Self-pay | Admitting: Anesthesiology

## 2011-11-05 ENCOUNTER — Inpatient Hospital Stay (HOSPITAL_COMMUNITY)
Admission: AD | Admit: 2011-11-05 | Discharge: 2011-11-08 | DRG: 765 | Disposition: A | Discharge status: Home Health | Payer: Medicaid Other | Source: Ambulatory Visit | Attending: Obstetrics | Admitting: Obstetrics

## 2011-11-05 ENCOUNTER — Other Ambulatory Visit: Payer: Self-pay | Admitting: Obstetrics

## 2011-11-05 ENCOUNTER — Encounter (HOSPITAL_COMMUNITY): Payer: Self-pay | Admitting: *Deleted

## 2011-11-05 ENCOUNTER — Inpatient Hospital Stay (HOSPITAL_COMMUNITY): Payer: Medicaid Other | Admitting: Anesthesiology

## 2011-11-05 ENCOUNTER — Encounter (HOSPITAL_COMMUNITY)
Admission: AD | Disposition: A | Discharge status: Home Health | Payer: Self-pay | Source: Ambulatory Visit | Attending: Obstetrics

## 2011-11-05 DIAGNOSIS — O1414 Severe pre-eclampsia complicating childbirth: Principal | ICD-10-CM | POA: Diagnosis present

## 2011-11-05 DIAGNOSIS — O1410 Severe pre-eclampsia, unspecified trimester: Secondary | ICD-10-CM

## 2011-11-05 LAB — COMPREHENSIVE METABOLIC PANEL
AST: 93 U/L — ABNORMAL HIGH (ref 0–37)
Albumin: 2.6 g/dL — ABNORMAL LOW (ref 3.5–5.2)
Alkaline Phosphatase: 154 U/L — ABNORMAL HIGH (ref 39–117)
BUN: 14 mg/dL (ref 6–23)
BUN: 9 mg/dL (ref 6–23)
CO2: 24 mEq/L (ref 19–32)
CO2: 25 mEq/L (ref 19–32)
Calcium: 8.8 mg/dL (ref 8.4–10.5)
Chloride: 102 mEq/L (ref 96–112)
Chloride: 98 mEq/L (ref 96–112)
Creatinine, Ser: 0.55 mg/dL (ref 0.50–1.10)
Creatinine, Ser: 0.53 mg/dL (ref 0.50–1.10)
GFR calc Af Amer: >90 mL/min (ref >90)
GFR calc non Af Amer: >90 mL/min (ref >90)
GFR calc non Af Amer: >90 mL/min (ref >90)
Glucose, Bld: 84 mg/dL (ref 70–99)
Potassium: 3.7 mEq/L (ref 3.5–5.1)
Total Bilirubin: 0.3 mg/dL (ref 0.3–1.2)
Total Bilirubin: 0.8 mg/dL (ref 0.3–1.2)

## 2011-11-05 LAB — LACTATE DEHYDROGENASE: LDH: 592 U/L — ABNORMAL HIGH (ref 94–250)

## 2011-11-05 LAB — DIFFERENTIAL
Eosinophils Relative: 0 % (ref 0–5)
Lymphocytes Relative: 12 % (ref 12–46)
Lymphs Abs: 1.3 10*3/uL (ref 0.7–4.0)
Monocytes Relative: 8 % (ref 3–12)

## 2011-11-05 LAB — URINALYSIS, ROUTINE W REFLEX MICROSCOPIC
Bilirubin Urine: NEGATIVE
Glucose, UA: NEGATIVE mg/dL
Ketones, ur: NEGATIVE mg/dL
Leukocytes, UA: NEGATIVE
Nitrite: NEGATIVE
Protein, ur: 100 mg/dL — AB
Specific Gravity, Urine: 1.02 (ref 1.005–1.030)
Urobilinogen, UA: 0.2 mg/dL (ref 0.0–1.0)
pH: 7.5 (ref 5.0–8.0)

## 2011-11-05 LAB — PREPARE RBC (CROSSMATCH)

## 2011-11-05 LAB — CBC
HCT: 37.3 % (ref 36.0–46.0)
HCT: 36.6 % (ref 36.0–46.0)
HCT: 37.8 % (ref 36.0–46.0)
Hemoglobin: 12.4 g/dL (ref 12.0–15.0)
Hemoglobin: 12.9 g/dL (ref 12.0–15.0)
MCH: 31.1 pg (ref 26.0–34.0)
MCH: 30.5 pg (ref 26.0–34.0)
MCV: 89.9 fL (ref 78.0–100.0)
MCV: 90.1 fL (ref 78.0–100.0)
MCV: 90.2 fL (ref 78.0–100.0)
Platelets: 123 10*3/uL — ABNORMAL LOW (ref 150–400)
Platelets: 97 10*3/uL — ABNORMAL LOW (ref 150–400)
RBC: 4.15 MIL/uL (ref 3.87–5.11)
RBC: 4.06 MIL/uL (ref 3.87–5.11)
RBC: 4.19 MIL/uL (ref 3.87–5.11)
RDW: 14.6 % (ref 11.5–15.5)
WBC: 13.3 10*3/uL — ABNORMAL HIGH (ref 4.0–10.5)
WBC: 11.2 10*3/uL — ABNORMAL HIGH (ref 4.0–10.5)
WBC: 10.1 10*3/uL (ref 4.0–10.5)

## 2011-11-05 LAB — DIC (DISSEMINATED INTRAVASCULAR COAGULATION)PANEL
Fibrinogen: 524 mg/dL — ABNORMAL HIGH (ref 204–475)
Platelets: 37 10*3/uL — ABNORMAL LOW (ref 150–400)
Smear Review: NONE SEEN
aPTT: 29 seconds (ref 24–37)

## 2011-11-05 LAB — LIPASE, BLOOD: Lipase: 24 U/L (ref 11–59)

## 2011-11-05 LAB — URINE MICROSCOPIC-ADD ON

## 2011-11-05 LAB — STREP B DNA PROBE

## 2011-11-05 LAB — RPR: RPR: NONREACTIVE

## 2011-11-05 LAB — RUBELLA SCREEN: Rubella: 179.6 IU/mL — ABNORMAL HIGH

## 2011-11-05 LAB — URIC ACID: Uric Acid, Serum: 6.3 mg/dL (ref 2.4–7.0)

## 2011-11-05 LAB — AMYLASE: Amylase: 57 U/L (ref 0–105)

## 2011-11-05 SURGERY — Surgical Case
Anesthesia: General | Site: Abdomen | Wound class: Clean Contaminated

## 2011-11-05 MED ORDER — SENNOSIDES-DOCUSATE SODIUM 8.6-50 MG PO TABS
2.0000 | ORAL_TABLET | Freq: Every day | ORAL | Status: DC
  Administered 2011-11-06 – 2011-11-07 (×2): 2 via ORAL

## 2011-11-05 MED ORDER — OXYTOCIN 20 UNITS IN LACTATED RINGERS INFUSION - SIMPLE: 1.0000 m[IU]/min | INTRAVENOUS | Status: DC

## 2011-11-05 MED ORDER — ONDANSETRON HCL 4 MG/2ML IJ SOLN
INTRAMUSCULAR | Status: AC
  Filled 2011-11-05: qty 2

## 2011-11-05 MED ORDER — MAGNESIUM SULFATE BOLUS VIA INFUSION
4.0000 g | Freq: Once | INTRAVENOUS | Status: AC
  Administered 2011-11-05: 4 g via INTRAVENOUS
  Filled 2011-11-05: qty 500

## 2011-11-05 MED ORDER — OXYTOCIN 20 UNITS IN LACTATED RINGERS INFUSION - SIMPLE
INTRAVENOUS | Status: AC
  Administered 2011-11-05: 100 mL/h via INTRAVENOUS
  Filled 2011-11-05: qty 1000

## 2011-11-05 MED ORDER — CEFAZOLIN SODIUM 1-5 GM-% IV SOLN
INTRAVENOUS | Status: DC | PRN
  Administered 2011-11-05: 2 g via INTRAVENOUS

## 2011-11-05 MED ORDER — SIMETHICONE 80 MG PO CHEW: 80.0000 mg | CHEWABLE_TABLET | ORAL | Status: DC | PRN

## 2011-11-05 MED ORDER — LIDOCAINE HCL (PF) 1 % IJ SOLN
30.0000 mL | INTRAMUSCULAR | Status: DC | PRN
  Filled 2011-11-05: qty 30

## 2011-11-05 MED ORDER — LABETALOL HCL 5 MG/ML IV SOLN
20.0000 mg | Freq: Once | INTRAVENOUS | Status: AC
  Administered 2011-11-05: 20 mg via INTRAVENOUS

## 2011-11-05 MED ORDER — TETANUS-DIPHTH-ACELL PERTUSSIS 5-2.5-18.5 LF-MCG/0.5 IM SUSP
0.5000 mL | Freq: Once | INTRAMUSCULAR | Status: AC
  Administered 2011-11-06: 0.5 mL via INTRAMUSCULAR
  Filled 2011-11-05: qty 0.5

## 2011-11-05 MED ORDER — LANOLIN HYDROUS EX OINT: 1.0000 "application " | TOPICAL_OINTMENT | CUTANEOUS | Status: DC | PRN

## 2011-11-05 MED ORDER — PROMETHAZINE HCL 25 MG/ML IJ SOLN: 6.2500 mg | INTRAMUSCULAR | Status: DC | PRN

## 2011-11-05 MED ORDER — NALOXONE HCL 0.4 MG/ML IJ SOLN: 0.4000 mg | INTRAMUSCULAR | Status: DC | PRN

## 2011-11-05 MED ORDER — FENTANYL CITRATE 0.05 MG/ML IJ SOLN
25.0000 ug | INTRAMUSCULAR | Status: DC | PRN
  Administered 2011-11-05 (×2): 25 ug via INTRAVENOUS

## 2011-11-05 MED ORDER — CITRIC ACID-SODIUM CITRATE 334-500 MG/5ML PO SOLN
30.0000 mL | ORAL | Status: DC | PRN
  Administered 2011-11-05: 30 mL via ORAL
  Filled 2011-11-05: qty 15

## 2011-11-05 MED ORDER — ONDANSETRON HCL 4 MG/2ML IJ SOLN
INTRAMUSCULAR | Status: DC | PRN
  Administered 2011-11-05: 4 mg via INTRAVENOUS

## 2011-11-05 MED ORDER — SUCCINYLCHOLINE CHLORIDE 20 MG/ML IJ SOLN
INTRAMUSCULAR | Status: DC | PRN
  Administered 2011-11-05: 120 mg via INTRAVENOUS

## 2011-11-05 MED ORDER — DIPHENHYDRAMINE HCL 25 MG PO CAPS: 25.0000 mg | ORAL_CAPSULE | Freq: Four times a day (QID) | ORAL | Status: DC | PRN

## 2011-11-05 MED ORDER — NALBUPHINE HCL 10 MG/ML IJ SOLN
10.0000 mg | Freq: Four times a day (QID) | INTRAMUSCULAR | Status: DC | PRN
  Filled 2011-11-05: qty 1

## 2011-11-05 MED ORDER — FENTANYL CITRATE 0.05 MG/ML IJ SOLN
INTRAMUSCULAR | Status: DC | PRN
  Administered 2011-11-05: 150 ug via INTRAVENOUS
  Administered 2011-11-05: 100 ug via INTRAVENOUS

## 2011-11-05 MED ORDER — MIDAZOLAM HCL 5 MG/5ML IJ SOLN
INTRAMUSCULAR | Status: DC | PRN
  Administered 2011-11-05: 2 mg via INTRAVENOUS

## 2011-11-05 MED ORDER — ACETAMINOPHEN-CODEINE #3 300-30 MG PO TABS
1.0000 | ORAL_TABLET | Freq: Once | ORAL | Status: AC
  Administered 2011-11-05: 1 via ORAL
  Filled 2011-11-05: qty 1

## 2011-11-05 MED ORDER — MAGNESIUM SULFATE 40 G IN LACTATED RINGERS - SIMPLE
2.0000 g/h | INTRAVENOUS | Status: DC
  Administered 2011-11-05 – 2011-11-06 (×2): 2 g/h via INTRAVENOUS
  Filled 2011-11-05: qty 500

## 2011-11-05 MED ORDER — SUCCINYLCHOLINE CHLORIDE 20 MG/ML IJ SOLN
INTRAMUSCULAR | Status: AC
  Filled 2011-11-05: qty 10

## 2011-11-05 MED ORDER — PRENATAL MULTIVITAMIN CH
1.0000 | ORAL_TABLET | Freq: Every day | ORAL | Status: DC
  Administered 2011-11-06 – 2011-11-08 (×3): 1 via ORAL
  Filled 2011-11-05 (×3): qty 1

## 2011-11-05 MED ORDER — ONDANSETRON HCL 4 MG/2ML IJ SOLN
4.0000 mg | Freq: Four times a day (QID) | INTRAMUSCULAR | Status: DC | PRN
  Administered 2011-11-05: 4 mg via INTRAVENOUS
  Filled 2011-11-05: qty 2

## 2011-11-05 MED ORDER — PHENYLEPHRINE HCL 10 MG/ML IJ SOLN
INTRAMUSCULAR | Status: DC | PRN
  Administered 2011-11-05: 40 ug via INTRAVENOUS
  Administered 2011-11-05 (×2): 80 ug via INTRAVENOUS

## 2011-11-05 MED ORDER — ONDANSETRON HCL 4 MG PO TABS: 4.0000 mg | ORAL_TABLET | ORAL | Status: DC | PRN

## 2011-11-05 MED ORDER — OXYCODONE-ACETAMINOPHEN 5-325 MG PO TABS
1.0000 | ORAL_TABLET | ORAL | Status: DC | PRN
  Administered 2011-11-05 (×3): 1 via ORAL
  Filled 2011-11-05 (×3): qty 1

## 2011-11-05 MED ORDER — ZOLPIDEM TARTRATE 5 MG PO TABS: 5.0000 mg | ORAL_TABLET | Freq: Every evening | ORAL | Status: DC | PRN

## 2011-11-05 MED ORDER — ONDANSETRON HCL 4 MG/2ML IJ SOLN: 4.0000 mg | Freq: Four times a day (QID) | INTRAMUSCULAR | Status: DC | PRN

## 2011-11-05 MED ORDER — OXYTOCIN 20 UNITS IN LACTATED RINGERS INFUSION - SIMPLE
125.0000 mL/h | INTRAVENOUS | Status: AC
  Administered 2011-11-05: 100 mL/h via INTRAVENOUS

## 2011-11-05 MED ORDER — PHENYLEPHRINE 40 MCG/ML (10ML) SYRINGE FOR IV PUSH (FOR BLOOD PRESSURE SUPPORT)
PREFILLED_SYRINGE | INTRAVENOUS | Status: AC
  Filled 2011-11-05: qty 5

## 2011-11-05 MED ORDER — OXYTOCIN 20 UNITS IN LACTATED RINGERS INFUSION - SIMPLE
1.0000 m[IU]/min | INTRAVENOUS | Status: DC
  Administered 2011-11-05: 2 m[IU]/min via INTRAVENOUS
  Filled 2011-11-05: qty 1000

## 2011-11-05 MED ORDER — LABETALOL HCL 5 MG/ML IV SOLN
40.0000 mg | Freq: Once | INTRAVENOUS | Status: AC
  Administered 2011-11-05: 40 mg via INTRAVENOUS
  Filled 2011-11-05 (×3): qty 4

## 2011-11-05 MED ORDER — ACETAMINOPHEN 325 MG PO TABS: 650.0000 mg | ORAL_TABLET | ORAL | Status: DC | PRN

## 2011-11-05 MED ORDER — CEFAZOLIN SODIUM 1-5 GM-% IV SOLN
INTRAVENOUS | Status: AC
  Filled 2011-11-05: qty 100

## 2011-11-05 MED ORDER — MIDAZOLAM HCL 2 MG/2ML IJ SOLN
INTRAMUSCULAR | Status: AC
  Filled 2011-11-05: qty 2

## 2011-11-05 MED ORDER — LACTATED RINGERS IV SOLN: INTRAVENOUS | Status: DC

## 2011-11-05 MED ORDER — OXYCODONE-ACETAMINOPHEN 5-325 MG PO TABS: 1.0000 | ORAL_TABLET | Freq: Once | ORAL | Status: DC

## 2011-11-05 MED ORDER — OXYTOCIN BOLUS FROM INFUSION
500.0000 mL | Freq: Once | INTRAVENOUS | Status: DC
  Filled 2011-11-05: qty 500

## 2011-11-05 MED ORDER — FENTANYL CITRATE 0.05 MG/ML IJ SOLN
INTRAMUSCULAR | Status: AC
  Filled 2011-11-05: qty 5

## 2011-11-05 MED ORDER — FENTANYL CITRATE 0.05 MG/ML IJ SOLN
INTRAMUSCULAR | Status: AC
  Administered 2011-11-05: 25 ug via INTRAVENOUS
  Filled 2011-11-05: qty 2

## 2011-11-05 MED ORDER — ACETAMINOPHEN 325 MG PO TABS: 325.0000 mg | ORAL_TABLET | ORAL | Status: DC | PRN

## 2011-11-05 MED ORDER — MAGNESIUM SULFATE 40 G IN LACTATED RINGERS - SIMPLE
2.0000 g/h | INTRAVENOUS | Status: DC
  Filled 2011-11-05: qty 500

## 2011-11-05 MED ORDER — IBUPROFEN 600 MG PO TABS: 600.0000 mg | ORAL_TABLET | Freq: Four times a day (QID) | ORAL | Status: DC | PRN

## 2011-11-05 MED ORDER — FLEET ENEMA 7-19 GM/118ML RE ENEM: 1.0000 | ENEMA | RECTAL | Status: DC | PRN

## 2011-11-05 MED ORDER — GI COCKTAIL ~~LOC~~
30.0000 mL | Freq: Once | ORAL | Status: AC
  Administered 2011-11-05: 30 mL via ORAL
  Filled 2011-11-05: qty 30

## 2011-11-05 MED ORDER — PROPOFOL 10 MG/ML IV EMUL
INTRAVENOUS | Status: AC
  Filled 2011-11-05: qty 20

## 2011-11-05 MED ORDER — LACTATED RINGERS IV SOLN
INTRAVENOUS | Status: DC | PRN
  Administered 2011-11-05: 20:00:00 via INTRAVENOUS

## 2011-11-05 MED ORDER — NALBUPHINE HCL 10 MG/ML IJ SOLN
10.0000 mg | INTRAMUSCULAR | Status: DC
  Filled 2011-11-05 (×8): qty 1

## 2011-11-05 MED ORDER — MENTHOL 3 MG MT LOZG: 1.0000 | LOZENGE | OROMUCOSAL | Status: DC | PRN

## 2011-11-05 MED ORDER — 0.9 % SODIUM CHLORIDE (POUR BTL) OPTIME
TOPICAL | Status: DC | PRN
  Administered 2011-11-05: 300 mL

## 2011-11-05 MED ORDER — SODIUM CHLORIDE 0.9 % IJ SOLN: 9.0000 mL | INTRAMUSCULAR | Status: DC | PRN

## 2011-11-05 MED ORDER — DIPHENHYDRAMINE HCL 50 MG/ML IJ SOLN: 12.5000 mg | Freq: Four times a day (QID) | INTRAMUSCULAR | Status: DC | PRN

## 2011-11-05 MED ORDER — LACTATED RINGERS IV SOLN
INTRAVENOUS | Status: DC
  Administered 2011-11-05 (×2): via INTRAVENOUS

## 2011-11-05 MED ORDER — DIBUCAINE 1 % RE OINT: 1.0000 "application " | TOPICAL_OINTMENT | RECTAL | Status: DC | PRN

## 2011-11-05 MED ORDER — HYDROXYZINE HCL 50 MG/ML IM SOLN
50.0000 mg | Freq: Four times a day (QID) | INTRAMUSCULAR | Status: DC | PRN
  Filled 2011-11-05: qty 1

## 2011-11-05 MED ORDER — ZOLPIDEM TARTRATE 10 MG PO TABS: 10.0000 mg | ORAL_TABLET | Freq: Every evening | ORAL | Status: DC | PRN

## 2011-11-05 MED ORDER — OXYCODONE-ACETAMINOPHEN 5-325 MG PO TABS
1.0000 | ORAL_TABLET | ORAL | Status: DC | PRN
  Administered 2011-11-06 – 2011-11-07 (×4): 2 via ORAL
  Administered 2011-11-07: 1 via ORAL
  Administered 2011-11-07: 2 via ORAL
  Filled 2011-11-05 (×2): qty 1
  Filled 2011-11-05: qty 2
  Filled 2011-11-05: qty 1
  Filled 2011-11-05 (×3): qty 2

## 2011-11-05 MED ORDER — LABETALOL HCL 5 MG/ML IV SOLN
20.0000 mg | Freq: Once | INTRAVENOUS | Status: AC
  Administered 2011-11-05: 20 mg via INTRAVENOUS
  Filled 2011-11-05: qty 4

## 2011-11-05 MED ORDER — ONDANSETRON HCL 4 MG/2ML IJ SOLN: 4.0000 mg | INTRAMUSCULAR | Status: DC | PRN

## 2011-11-05 MED ORDER — FAMOTIDINE IN NACL 20-0.9 MG/50ML-% IV SOLN
20.0000 mg | INTRAVENOUS | Status: DC
  Filled 2011-11-05: qty 50

## 2011-11-05 MED ORDER — SIMETHICONE 80 MG PO CHEW
80.0000 mg | CHEWABLE_TABLET | Freq: Three times a day (TID) | ORAL | Status: DC
  Administered 2011-11-06 – 2011-11-08 (×6): 80 mg via ORAL

## 2011-11-05 MED ORDER — DEXAMETHASONE SODIUM PHOSPHATE 10 MG/ML IJ SOLN
INTRAMUSCULAR | Status: DC | PRN
  Administered 2011-11-05: 10 mg via INTRAVENOUS

## 2011-11-05 MED ORDER — OXYTOCIN 20 UNITS IN LACTATED RINGERS INFUSION - SIMPLE: 125.0000 mL/h | Freq: Once | INTRAVENOUS | Status: DC

## 2011-11-05 MED ORDER — WITCH HAZEL-GLYCERIN EX PADS: 1.0000 "application " | MEDICATED_PAD | CUTANEOUS | Status: DC | PRN

## 2011-11-05 MED ORDER — DIPHENHYDRAMINE HCL 12.5 MG/5ML PO ELIX
12.5000 mg | ORAL_SOLUTION | Freq: Four times a day (QID) | ORAL | Status: DC | PRN
  Filled 2011-11-05: qty 5

## 2011-11-05 MED ORDER — HYDROMORPHONE 0.3 MG/ML IV SOLN
INTRAVENOUS | Status: DC
  Administered 2011-11-05: 23:00:00 via INTRAVENOUS
  Administered 2011-11-06: 0.3 mg via INTRAVENOUS
  Administered 2011-11-06: 0.479 mg via INTRAVENOUS

## 2011-11-05 MED ORDER — LACTATED RINGERS IV SOLN: 500.0000 mL | INTRAVENOUS | Status: DC | PRN

## 2011-11-05 MED ORDER — PROPOFOL 10 MG/ML IV EMUL
INTRAVENOUS | Status: DC | PRN
  Administered 2011-11-05: 160 mg via INTRAVENOUS

## 2011-11-05 MED ORDER — HYDROMORPHONE 0.3 MG/ML IV SOLN
INTRAVENOUS | Status: AC
  Filled 2011-11-05: qty 25

## 2011-11-05 MED ORDER — OXYTOCIN 10 UNIT/ML IJ SOLN
40.0000 [IU] | INTRAVENOUS | Status: DC | PRN
  Administered 2011-11-05: 40 [IU] via INTRAVENOUS

## 2011-11-05 SURGICAL SUPPLY — 39 items
ADH SKN CLS APL DERMABOND .7 (GAUZE/BANDAGES/DRESSINGS)
CANISTER WOUND CARE 500ML ATS (WOUND CARE) IMPLANT
CHLORAPREP W/TINT 26ML (MISCELLANEOUS) ×1 IMPLANT
CLOTH BEACON ORANGE TIMEOUT ST (SAFETY) ×2 IMPLANT
CONTAINER PREFILL 10% NBF 15ML (MISCELLANEOUS) ×4 IMPLANT
DERMABOND ADVANCED (GAUZE/BANDAGES/DRESSINGS)
DERMABOND ADVANCED .7 DNX12 (GAUZE/BANDAGES/DRESSINGS) ×1 IMPLANT
DRSG VAC ATS LRG SENSATRAC (GAUZE/BANDAGES/DRESSINGS) IMPLANT
DRSG VAC ATS MED SENSATRAC (GAUZE/BANDAGES/DRESSINGS) IMPLANT
DRSG VAC ATS SM SENSATRAC (GAUZE/BANDAGES/DRESSINGS) IMPLANT
ELECT REM PT RETURN 9FT ADLT (ELECTROSURGICAL) ×2
ELECTRODE REM PT RTRN 9FT ADLT (ELECTROSURGICAL) ×1 IMPLANT
EXTRACTOR VACUUM M CUP 4 TUBE (SUCTIONS) IMPLANT
GLOVE BIO SURGEON STRL SZ8 (GLOVE) ×4 IMPLANT
GOWN PREVENTION PLUS LG XLONG (DISPOSABLE) ×4 IMPLANT
GOWN PREVENTION PLUS XLARGE (GOWN DISPOSABLE) ×2 IMPLANT
KIT ABG SYR 3ML LUER SLIP (SYRINGE) ×1 IMPLANT
NDL HYPO 25X5/8 SAFETYGLIDE (NEEDLE) ×1 IMPLANT
NEEDLE HYPO 25X5/8 SAFETYGLIDE (NEEDLE) ×2 IMPLANT
NS IRRIG 1000ML POUR BTL (IV SOLUTION) ×2 IMPLANT
PACK C SECTION WH (CUSTOM PROCEDURE TRAY) ×2 IMPLANT
RTRCTR C-SECT PINK 25CM LRG (MISCELLANEOUS) ×1 IMPLANT
SLEEVE SCD COMPRESS KNEE MED (MISCELLANEOUS) IMPLANT
STAPLER VISISTAT 35W (STAPLE) ×1 IMPLANT
SUT GUT PLAIN 0 CT-3 TAN 27 (SUTURE) ×2 IMPLANT
SUT MNCRL 0 VIOLET CTX 36 (SUTURE) ×3 IMPLANT
SUT MNCRL AB 4-0 PS2 18 (SUTURE) IMPLANT
SUT MON AB 2-0 CT1 27 (SUTURE) ×2 IMPLANT
SUT MON AB 3-0 SH 27 (SUTURE)
SUT MON AB 3-0 SH27 (SUTURE) IMPLANT
SUT MONOCRYL 0 CTX 36 (SUTURE) ×3
SUT PLAIN 2 0 XLH (SUTURE) IMPLANT
SUT VIC AB 0 CTX 36 (SUTURE) ×4
SUT VIC AB 0 CTX36XBRD ANBCTRL (SUTURE) ×2 IMPLANT
SUT VIC AB 2-0 CT1 27 (SUTURE)
SUT VIC AB 2-0 CT1 TAPERPNT 27 (SUTURE) IMPLANT
TOWEL OR 17X24 6PK STRL BLUE (TOWEL DISPOSABLE) ×4 IMPLANT
TRAY FOLEY CATH 14FR (SET/KITS/TRAYS/PACK) ×2 IMPLANT
WATER STERILE IRR 1000ML POUR (IV SOLUTION) ×2 IMPLANT

## 2011-11-05 NOTE — ED Notes (Signed)
Threasa Heads CNM notified of pt's pain of '0' and abnormal lab results. Aware of elevated B/P despite pt's pain being gone. Will be back to see pt

## 2011-11-05 NOTE — Progress Notes (Signed)
PCA started

## 2011-11-05 NOTE — Progress Notes (Signed)
Ultrasound at bedside to do bedside ultrasound to confirm presentation

## 2011-11-05 NOTE — Plan of Care (Signed)
Problem: Consults Goal: Neonatologist Consult Outcome: Completed/Met Date Met:  11/05/11 DR Katrinka Blazing Mertie Moores

## 2011-11-05 NOTE — Anesthesia Procedure Notes (Signed)
Procedure Name: MAC Performed by: Shakeeta Godette D Pre-anesthesia Checklist: Patient identified, Emergency Drugs available, Suction available, Timeout performed and Patient being monitored Patient Re-evaluated:Patient Re-evaluated prior to inductionOxygen Delivery Method: Circle System Utilized Preoxygenation: Pre-oxygenation with 100% oxygen Intubation Type: IV induction, Rapid sequence and Cricoid Pressure applied Laryngoscope Size: Mac and 3 Grade View: Grade II Tube type: Oral Airway Equipment and Method: stylet and video-laryngoscopy Placement Confirmation: ETT inserted through vocal cords under direct vision,  positive ETCO2 and breath sounds checked- equal and bilateral Secured at: 20 cm Tube secured with: Tape Dental Injury: Teeth and Oropharynx as per pre-operative assessment

## 2011-11-05 NOTE — ED Provider Notes (Signed)
History     Chief Complaint  Patient presents with  . Contractions   HPI  Pt presents with report of upper abdominal pain that started yesterday.  Pain is described as burning and not associated with eating.  Denies vision changes or headaches.  No vaginal bleeding, +intermittent contractions.    History reviewed. No pertinent past medical history.  History reviewed. No pertinent past surgical history.  Family History  Problem Relation Age of Onset  . Anesthesia problems Neg Hx   . Malignant hyperthermia Neg Hx   . Hypotension Neg Hx   . Pseudochol deficiency Neg Hx     History  Substance Use Topics  . Smoking status: Never Smoker   . Smokeless tobacco: Never Used  . Alcohol Use: No    Allergies: No Known Allergies  Prescriptions prior to admission  Medication Sig Dispense Refill  . acetaminophen (TYLENOL) 500 MG tablet Take 500 mg by mouth every 6 (six) hours as needed. Takes for pain       . prenatal vitamin w/FE, FA (PRENATAL 1 + 1) 27-1 MG TABS Take 1 tablet by mouth daily.          Review of Systems  Gastrointestinal: Positive for abdominal pain.  All other systems reviewed and are negative.   Physical Exam   Blood pressure 127/84, pulse 73, temperature 98.2 F (36.8 C), temperature source Oral, resp. rate 20, height 4\' 11"  (1.499 m).  Physical Exam  Constitutional: She is oriented to person, place, and time. She appears well-developed and well-nourished. She appears distressed (appears uncomfortable).  HENT:  Head: Normocephalic.  Neck: Normal range of motion. Neck supple.  Cardiovascular: Normal rate and regular rhythm.   Respiratory: Effort normal and breath sounds normal.  GI: Soft. There is tenderness (upper abdominal).  Genitourinary: No bleeding around the vagina. Vaginal discharge (mucusy) found.       Cervix 1/50/-2  Musculoskeletal:       Trace pedal edema  Neurological: She is alert and oriented to person, place, and time. She has normal  reflexes. She displays normal reflexes.  Skin: Skin is warm and dry.    MAU Course  Procedures  Results for orders placed during the hospital encounter of 11/05/11 (from the past 24 hour(s))  CBC     Status: Abnormal   Collection Time   11/05/11  1:15 AM      Component Value Range   WBC 13.3 (*) 4.0 - 10.5 (K/uL)   RBC 4.15  3.87 - 5.11 (MIL/uL)   Hemoglobin 12.9  12.0 - 15.0 (g/dL)   HCT 16.1  09.6 - 04.5 (%)   MCV 89.9  78.0 - 100.0 (fL)   MCH 31.1  26.0 - 34.0 (pg)   MCHC 34.6  30.0 - 36.0 (g/dL)   RDW 40.9  81.1 - 91.4 (%)   Platelets 123 (*) 150 - 400 (K/uL)  COMPREHENSIVE METABOLIC PANEL     Status: Abnormal   Collection Time   11/05/11  1:15 AM      Component Value Range   Sodium 135  135 - 145 (mEq/L)   Potassium 3.9  3.5 - 5.1 (mEq/L)   Chloride 102  96 - 112 (mEq/L)   CO2 24  19 - 32 (mEq/L)   Glucose, Bld 84  70 - 99 (mg/dL)   BUN 14  6 - 23 (mg/dL)   Creatinine, Ser 7.82  0.50 - 1.10 (mg/dL)   Calcium 8.8  8.4 - 95.6 (mg/dL)   Total  Protein 6.0  6.0 - 8.3 (g/dL)   Albumin 2.8 (*) 3.5 - 5.2 (g/dL)   AST 93 (*) 0 - 37 (U/L)   ALT 90 (*) 0 - 35 (U/L)   Alkaline Phosphatase 155 (*) 39 - 117 (U/L)   Total Bilirubin 0.3  0.3 - 1.2 (mg/dL)   GFR calc non Af Amer >90  >90 (mL/min)   GFR calc Af Amer >90  >90 (mL/min)  URINALYSIS, ROUTINE W REFLEX MICROSCOPIC     Status: Abnormal   Collection Time   11/05/11  1:35 AM      Component Value Range   Color, Urine YELLOW  YELLOW    APPearance CLEAR  CLEAR    Specific Gravity, Urine 1.020  1.005 - 1.030    pH 7.5  5.0 - 8.0    Glucose, UA NEGATIVE  NEGATIVE (mg/dL)   Hgb urine dipstick TRACE (*) NEGATIVE    Bilirubin Urine NEGATIVE  NEGATIVE    Ketones, ur NEGATIVE  NEGATIVE (mg/dL)   Protein, ur 161 (*) NEGATIVE (mg/dL)   Urobilinogen, UA 0.2  0.0 - 1.0 (mg/dL)   Nitrite NEGATIVE  NEGATIVE    Leukocytes, UA NEGATIVE  NEGATIVE   URINE MICROSCOPIC-ADD ON     Status: Abnormal   Collection Time   11/05/11  1:35 AM       Component Value Range   Squamous Epithelial / LPF MANY (*) RARE    WBC, UA 0-2  <3 (WBC/hpf)   RBC / HPF 3-6  <3 (RBC/hpf)   Bacteria, UA MANY (*) RARE   AMYLASE     Status: Normal   Collection Time   11/05/11  1:39 AM      Component Value Range   Amylase 57  0 - 105 (U/L)  LIPASE, BLOOD     Status: Normal   Collection Time   11/05/11  1:39 AM      Component Value Range   Lipase 24  11 - 59 (U/L)     Assessment and Plan  Severe Preeclampsia  Plan: Admit to [facility] Sulfate Induction of labor  [facility] 11/05/2011, 3:51 AM

## 2011-11-05 NOTE — Anesthesia Preprocedure Evaluation (Addendum)
Anesthesia Evaluation  Patient identified by MRN, date of birth, ID band Patient awake    Reviewed: Allergy & Precautions, H&P , Patient's Chart, lab work & pertinent test results, reviewed documented beta blocker date and time   History of Anesthesia Complications Negative for: history of anesthetic complications  Airway Mallampati: IV TM Distance: >3 FB Neck ROM: full    Dental No notable dental hx.    Pulmonary neg pulmonary ROS,  clear to auscultation  Pulmonary exam normal       Cardiovascular Exercise Tolerance: Good neg cardio ROS regular Normal    Neuro/Psych Negative Neurological ROS  Negative Psych ROS   GI/Hepatic negative GI ROS, Neg liver ROS,   Endo/Other  Negative Endocrine ROS  Renal/GU negative Renal ROS     Musculoskeletal   Abdominal   Peds  Hematology negative hematology ROS (+)   Anesthesia Other Findings Very edematous Patient lethargic  Reproductive/Obstetrics negative OB ROS                          Anesthesia Physical Anesthesia Plan  ASA: III and Emergent  Anesthesia Plan: General ETT   Post-op Pain Management:    Induction: Cricoid pressure planned, Rapid sequence and Intravenous  Airway Management Planned: Oral ETT  Additional Equipment:   Intra-op Plan:   Post-operative Plan:   Informed Consent: I have reviewed the patients History and Physical, chart, labs and discussed the procedure including the risks, benefits and alternatives for the proposed anesthesia with the patient or authorized representative who has indicated his/her understanding and acceptance.   Dental Advisory Given  Plan Discussed with: CRNA and Surgeon  Anesthesia Plan Comments:         Anesthesia Quick Evaluation

## 2011-11-05 NOTE — Anesthesia Postprocedure Evaluation (Signed)
Anesthesia Post Note  Patient: Diane Huber  Procedure(s) Performed: Procedure(s) (LRB): CESAREAN SECTION (N/A)  Anesthesia type: GA  Patient location: PACU  Post pain: Pain level controlled  Post assessment: Post-op Vital signs reviewed  Last Vitals:  Filed Vitals:   11/05/11 2100  BP: 123/69  Pulse: 90  Temp:   Resp: 16    Post vital signs: Reviewed  Level of consciousness: sedated  Complications: No apparent anesthesia complications

## 2011-11-05 NOTE — Progress Notes (Signed)
Pt sleeping. 

## 2011-11-05 NOTE — Progress Notes (Signed)
Called dr Clearance Coots informed of platelet count now 97, no new orders at this time

## 2011-11-05 NOTE — ED Notes (Signed)
Report called to [facility] in [facility]. Will do bedside u/sin BS to confirm vertex.

## 2011-11-05 NOTE — Progress Notes (Signed)
EFM off.Pt up to BR. WIll start IV and transfer to Bs.

## 2011-11-05 NOTE — Progress Notes (Signed)
Diane Huber is a 31 y.o. G4P1020 at [redacted]w[redacted]d by LMP admitted for induction of labor due to Pre-eclamptic toxemia of pregnancy..  Subjective:   Objective: BP 175/99  Pulse 79  Temp(Src) 98.9 F (37.2 C) (Oral)  Resp 20  Ht 4\' 11"  (1.499 m)  Wt 161 lb (73.029 kg)  BMI 32.52 kg/m2 I/O last 3 completed shifts: In: 1020.4 [P.O.:600; I.V.:420.4] Out: 275 [Urine:275] Total I/O In: 1201.3 [P.O.:570; I.V.:631.3] Out: 575 [Urine:575]  FHT:  FHR: 150 bpm, variability: moderate,  accelerations:  Present,  decelerations:  Absent UC:   irregular, every 4-7 minutes SVE:   Dilation: Fingertip Effacement (%): 50 Station: -2 Exam by::  (confirmed by ultrasound)  Labs: Lab Results  Component Value Date   WBC 11.2* 11/05/2011   HGB 12.4 11/05/2011   HCT 36.6 11/05/2011   MCV 90.1 11/05/2011   PLT 97* 11/05/2011    Assessment / Plan: Induction of labor due to preeclampsia and HELLP,  progressing well on pitocin  Labor: Progressing normally Preeclampsia:  on magnesium sulfate Fetal Wellbeing:  Category I Pain Control:  Nubain/Vistaril I/D:  n/a Anticipated MOD:  NSVD  Adama Ferber A 11/05/2011, 12:41 PM

## 2011-11-05 NOTE — Progress Notes (Signed)
Diane Huber is a 31 y.o. G4P1020 at [redacted]w[redacted]d by LMP admitted for induction of labor due to Pre-eclamptic toxemia of pregnancy..  Subjective:   Objective: BP 131/84  Pulse 91  Temp(Src) 98.5 F (36.9 C) (Oral)  Resp 18  Ht 4\' 11"  (1.499 m)  Wt 161 lb (73.029 kg)  BMI 32.52 kg/m2 I/O last 3 completed shifts: In: 1020.4 [P.O.:600; I.V.:420.4] Out: 275 [Urine:275] Total I/O In: 1811.2 [P.O.:1432; I.V.:379.2] Out: 275 [Urine:275]  FHT:  FHR: 150 bpm, variability: moderate,  accelerations:  Present,  decelerations:  Absent UC:   irregular, every 10 minutes SVE:   Dilation: Fingertip Effacement (%): 50 Station: -2 Exam by::  (confirmed by ultrasound)  Labs: Lab Results  Component Value Date   WBC 11.2* 11/05/2011   HGB 12.4 11/05/2011   HCT 36.6 11/05/2011   MCV 90.1 11/05/2011   PLT 97* 11/05/2011    Assessment / Plan: Induction of labor due to preeclampsia and HELLP,  progressing well on pitocin  Labor: Progressing normally Preeclampsia:  on magnesium sulfate Fetal Wellbeing:  Category I Pain Control:  Nubain/Vistaril I/D:  n/a Anticipated MOD:  NSVD  Diane Huber A 11/05/2011, 10:51 AM

## 2011-11-05 NOTE — Progress Notes (Signed)
Pt to AICU via stretcher, oriented to AICU, visitation policy and ELink. Husband to bedside.

## 2011-11-05 NOTE — Consult Note (Signed)
[facility] Neonatal Medicine  Delivery Note:  C-section       11/05/2011  8:14 PM  I was called to the operating room at the request of the patient's obstetrician (Dr. Clearance Coots) due to preterm delivery at 34 4/7 weeks.  PRENATAL HX:  PIH, HELLP syndrome.  INTRAPARTUM HX:   Induction of labor due to worsening HELLP.  Not progressing, but due to worsening maternal condition, taken to the OR for delivery.  DELIVERY:   Complicated by general anesthesia and difficulty extracting the baby from uterus (required two attempts with vacuum).  Baby delivered vertex.  Initially apneic and bradycardic, baby was bulb suctioned quickly (mouth and nose), stimulated, then given bag/mask ventilations.  Slowly the HR rose, and was over 100 bpm by 1 minute of age.  Bag/mask stopped at 1 minute.  The baby's color quickly became pink centrally, but after a couple of minutes, appeared dusky again (despite crying and breathing).  Blowby oxygen given for another minute with improvement in color.  At 5 minutes, baby wrapped in warm blanket and placed in transport isolette.  He was taken to the NICU for further care.  He remained pink during transport.  Apgars 4 and 8.     _____________________ Electronically Signed By: Angelita Ingles, MD Neonatologist

## 2011-11-05 NOTE — Consult Note (Signed)
[facility]  Neonatal Medicine Consultation       11/05/2011    2:19 PM  I was called at the request of the patient's obstetrician (Dr. Gaynell Face) to speak to this patient due to [facility], HELLP, 34 4/[redacted] weeks gestation.  Mom is in L&D for induction.  She and the baby's father are Spanish-speaking, so I had a Nurse, learning disability available.  The baby is a boy.  I reviewed our expectation for the baby's survival, but need for intensive care for 1-3 weeks following birth.  I covered respiratory distress, immature feeding skills, need for IV fluids and gavage feeding.  Mom plans to breast feed, so need for breast pump mentioned.  I reviewed how we would work during the delivery, and how baby will go to the NICU with his father.  The patient and her partner had no questions.     _____________________ Electronically Signed By: Angelita Ingles, MD Neonatologist

## 2011-11-05 NOTE — OR Nursing (Signed)
Fundal massage DLWegner RN 

## 2011-11-05 NOTE — Progress Notes (Signed)
Analea Muller is a 31 y.o. G4P1020 at [redacted]w[redacted]d by LMP admitted for induction of labor due to Pre-eclamptic toxemia of pregnancy.Marland KitchenHELLP Syndrome.  Subjective:   Objective: BP 140/93  Pulse 106  Temp(Src) 99.1 F (37.3 C) (Oral)  Resp 20  Ht 4\' 11"  (1.499 m)  Wt 161 lb (73.029 kg)  BMI 32.52 kg/m2 I/O last 3 completed shifts: In: 3372.5 [P.O.:1450; I.V.:1922.5] Out: 1500 [Urine:1500]    FHT:  FHR: 150 bpm, variability: moderate,  accelerations:  Present,  decelerations:  Absent UC:   regular, every 3-5 minutes SVE:   Dilation: Fingertip Effacement (%): 50 Station: -2 Exam by::  (Foley bulg intact, even while vomiting in bathroom standing)  Labs: Lab Results  Component Value Date   WBC 10.1 11/05/2011   HGB 12.9 11/05/2011   HCT 37.8 11/05/2011   MCV 90.2 11/05/2011   PLT 44* 11/05/2011    Assessment / Plan: Protracted latent phase.  No progress with cervical ripening.  Worsening severe pre-eclampsia.  Poor prognosis for rapid vaginal delivery.  Will proceed with C/S for worsening severe pre-eclampsia.  HELLP Syndrome.  Labor: no progress. Preeclampsia:  on magnesium sulfate and labs unstable, plts, 44,000. Fetal Wellbeing:  Category I Pain Control:  Nubain I/D:  n/a Anticipated MOD:  C/S  Marke Goodwyn A 11/05/2011, 7:35 PM

## 2011-11-05 NOTE — H&P (Signed)
Diane Huber is a 31 y.o. female presenting for abdominal pain.. Maternal Medical History:  Reason for admission: Reason for admission: contractions.  Contractions: Onset was 3-5 hours ago.   Frequency: irregular.   Duration is approximately 30 seconds.    Fetal activity: Perceived fetal activity is normal.   Last perceived fetal movement was within the past hour.    Prenatal complications: no prenatal complications Prenatal Complications - Diabetes: none.    OB History    Grav Para Term Preterm Abortions TAB SAB Ect Mult Living   4 1 1  2 1 1         History reviewed. No pertinent past medical history. History reviewed. No pertinent past surgical history. Family History: family history is negative for Anesthesia problems, and Malignant hyperthermia, and Hypotension, and Pseudochol deficiency, . Social History:  reports that she has never smoked. She has never used smokeless tobacco. She reports that she does not drink alcohol or use illicit drugs.  Review of Systems  Gastrointestinal: Positive for abdominal pain.  All other systems reviewed and are negative.    Dilation: Fingertip Effacement (%): 50 Station: -2 Exam by::  (confirmed by ultrasound) Blood pressure 142/89, pulse 78, temperature 98.5 F (36.9 C), temperature source Oral, resp. rate 17, height 4\' 11"  (1.499 m), weight 161 lb (73.029 kg). Maternal Exam:  Uterine Assessment: Contraction strength is mild.  Contraction duration is 30 seconds. Abdomen: Patient reports the following abdominal tenderness: epigastric.  Estimated fetal weight is 3 lbs..   Fetal presentation: vertex  Introitus: Normal vulva. Normal vagina.    Physical Exam  Nursing note and vitals reviewed. Constitutional: She is oriented to person, place, and time. She appears well-developed and well-nourished.  HENT:  Head: Normocephalic and atraumatic.  Eyes: Conjunctivae and EOM are normal. Pupils are equal, round, and reactive to light.    Neck: Normal range of motion. Neck supple.  Cardiovascular: Normal rate and regular rhythm.   Respiratory: Effort normal and breath sounds normal.  GI: Soft. Bowel sounds are normal. There is tenderness in the epigastric area.  Genitourinary: Vagina normal and uterus normal.  Musculoskeletal: Normal range of motion. She exhibits edema.  Neurological: She is alert and oriented to person, place, and time. She has normal reflexes.  Skin: Skin is warm and dry.  Psychiatric: She has a normal mood and affect. Her behavior is normal. Judgment and thought content normal.    Prenatal labs: ABO, Rh: --/--/O POS (02/17 0557) Antibody: NEG (02/17 0557) Rubella:   RPR:    HBsAg:    HIV: Non-reactive, Non-reactive (02/17 0000)  GBS:     Assessment/Plan: 34.4 weeks.  Severe pre-eclampsia.  Admit.  IOL.  Magnesium sulfate seizure prophylaxis.   Diane Huber A 11/05/2011, 10:41 AM

## 2011-11-05 NOTE — ED Notes (Signed)
Threasa Heads CNM in to see pt. In-house interpreter helping

## 2011-11-05 NOTE — Plan of Care (Signed)
Problem: Consults Goal: Birthing Suites Patient Information Press F2 to bring up selections list  Outcome: Completed/Met Date Met:  11/05/11  Pt < [redacted] weeks EGA and PIH (Pregnancy induced hypertension) Goal: Prenatal labs/testing reviewed upon admission Not found in prenatals-ordered on admission.

## 2011-11-05 NOTE — Transfer of Care (Signed)
Immediate Anesthesia Transfer of Care Note  Patient: Diane Huber  Procedure(s) Performed: Procedure(s) (LRB): CESAREAN SECTION (N/A)  Patient Location: PACU  Anesthesia Type: General  Level of Consciousness: sedated  Airway & Oxygen Therapy: Patient Spontanous Breathing and Patient connected to face mask oxygen  Post-op Assessment: Report given to PACU RN and Post -op Vital signs reviewed and stable  Post vital signs: stable  Complications: No apparent anesthesia complications

## 2011-11-05 NOTE — Progress Notes (Signed)
Pt states, " I've had contractions since last night at 1:30 am, but they had have been regular since 7 pm tonight. I have pain in my upper abdomen and my whole back".

## 2011-11-06 ENCOUNTER — Encounter (HOSPITAL_COMMUNITY): Payer: Self-pay | Admitting: Obstetrics

## 2011-11-06 LAB — CBC
HCT: 26.9 % — ABNORMAL LOW (ref 36.0–46.0)
MCH: 31.1 pg (ref 26.0–34.0)
MCHC: 34.6 g/dL (ref 30.0–36.0)
MCV: 90 fL (ref 78.0–100.0)
Platelets: 35 10*3/uL — ABNORMAL LOW (ref 150–400)
RDW: 14.7 % (ref 11.5–15.5)
WBC: 11.4 10*3/uL — ABNORMAL HIGH (ref 4.0–10.5)

## 2011-11-06 NOTE — Progress Notes (Signed)
Patient ID: Diane Huber, female   DOB: August 07, 1981, 31 y.o.   MRN: [id number] Postop day 1 Blood pressure 117/73 output good platelets 34 did not drop since surgery abdomen soft incision clean legs negative C-section  For heelpsyndrome doing well postop

## 2011-11-06 NOTE — Addendum Note (Signed)
Addendum  created 11/06/11 0748 by Fanny Dance, CRNA   Modules edited:Notes Section

## 2011-11-06 NOTE — Progress Notes (Signed)
[facility] Department of Clinical Social Work Documentation of Interpretation   I assisted ___Kim RN________________ with interpretation of _questions___________________ for this patient. 

## 2011-11-06 NOTE — Progress Notes (Signed)
1610 - Pt. OOB to stand, gait steady, brought to NICU via wheelchair to visit with baby w/ s.o.  Spanish Interpreter met up with pt. & s.o. In NICU

## 2011-11-06 NOTE — Progress Notes (Signed)
Pt transferred to [facility] Unit... Condition stable. Interpreter Sharl Ma explained transfer to pt... Pt had no questions.

## 2011-11-06 NOTE — Anesthesia Postprocedure Evaluation (Signed)
  Anesthesia Post-op Note  Patient: Diane Huber  Procedure(s) Performed: Procedure(s) (LRB): CESAREAN SECTION (N/A)  Patient Location: A-ICU  Anesthesia Type: General  Level of Consciousness: alert  and oriented  Airway and Oxygen Therapy: Patient Spontanous Breathing  Post-op Pain: mild  Post-op Assessment: Patient's Cardiovascular Status Stable and Respiratory Function Stable  Post-op Vital Signs: stable  Complications: No apparent anesthesia complications

## 2011-11-06 NOTE — Progress Notes (Signed)
UR chart review completed.  

## 2011-11-07 LAB — PREPARE FRESH FROZEN PLASMA: Unit division: 0

## 2011-11-07 LAB — DIFFERENTIAL
Basophils Relative: 0 % (ref 0–1)
Eosinophils Absolute: 0.1 10*3/uL (ref 0.0–0.7)
Eosinophils Relative: 1 % (ref 0–5)
Lymphs Abs: 2.8 10*3/uL (ref 0.7–4.0)
Monocytes Relative: 9 % (ref 3–12)
Neutrophils Relative %: 70 % (ref 43–77)

## 2011-11-07 LAB — CBC
MCH: 31.1 pg (ref 26.0–34.0)
MCHC: 33.3 g/dL (ref 30.0–36.0)
MCV: 93.3 fL (ref 78.0–100.0)
Platelets: 68 10*3/uL — ABNORMAL LOW (ref 150–400)
RBC: 2.54 MIL/uL — ABNORMAL LOW (ref 3.87–5.11)

## 2011-11-07 NOTE — Progress Notes (Signed)
Patient ID: Diane Huber, female   DOB: [birth year], 32 y.o.   MRN: [id number] Postop day 2 Vital signs normal Fundus firm Legs negative Hemoglobin 7.9 and platelets of   69,000 from 34,000 yesterday No complaints

## 2011-11-08 MED ORDER — ONDANSETRON HCL 4 MG/2ML IJ SOLN
INTRAMUSCULAR | Status: AC
  Filled 2011-11-08: qty 2

## 2011-11-08 MED ORDER — FENTANYL CITRATE 0.05 MG/ML IJ SOLN
INTRAMUSCULAR | Status: AC
  Filled 2011-11-08: qty 2

## 2011-11-08 MED ORDER — OXYTOCIN 10 UNIT/ML IJ SOLN
INTRAMUSCULAR | Status: AC
  Filled 2011-11-08: qty 2

## 2011-11-08 MED ORDER — MORPHINE SULFATE 0.5 MG/ML IJ SOLN
INTRAMUSCULAR | Status: AC
  Filled 2011-11-08: qty 10

## 2011-11-08 NOTE — Progress Notes (Signed)
[facility] Department of Clinical Social Work Documentation of Interpretation   I assisted _Chris RN__________________ with interpretation of ____discharge__________________ for this patient.

## 2011-11-08 NOTE — Discharge Instructions (Signed)
Discharge instructions   You can wash your hair  Shower  Eat what you want  Drink what you want  See me in 6 weeks  Your ankles are going to swell more in the next 2 weeks than when pregnant  No sex for 6 weeks   Trevious Rampey A, MD 11/08/2011    

## 2011-11-08 NOTE — Progress Notes (Signed)
Patient ID: Diane Huber, female   DOB: 1981-04-07, 31 y.o.   MRN: [id number] Postoperative day #3 Vital signs normal Abdomen soft Fundus firm Lochia moderate Discharge home today

## 2011-11-08 NOTE — Progress Notes (Signed)
Pt d/c home  Staples removed  Interpreter  In for completion of teaching  Prior to d/c ambulated out plan to go to wic for pump

## 2011-11-08 NOTE — Progress Notes (Signed)
PSYCHOSOCIAL ASSESSMENT ~ MATERNAL/CHILD Name: Diane Huber                                                                                                          Age: 31   Referral Date: 11/07/11 Reason/Source: NICU Support/NICU  I. FAMILY/HOME ENVIRONMENT A. Child's Legal Guardian _x__Parent(s) ___Grandparent ___Foster parent ___DSS_________________ Name: Diane Huber                               DOB: Jul 02, 1981           Age: 24  Address: [address], [place], [place] [address]  Name: Diane Huber                      DOB: //                     Age:   Address: same  B. Other Household Members/Support Persons Name: Diane Huber (9)                        Relationship: MOB's son                    Name:                                         Relationship:                        DOB ___/___/___                   Name:                                         Relationship:                        DOB ___/___/___                   Name:                                         Relationship:                        DOB ___/___/___  C. Other Support: family   II. PSYCHOSOCIAL DATA A. Information Source  _x_Patient Interview  _x_Family Interview           _x_Other: chart  B. Event organiser __Employment: __Medicaid    [place]:                 __Private Insurance:                   __Self Pay  __Food Stamps   __WIC __[employer]     __Public Housing     __Section 8    __Maternity Care Coordination/Child Service Coordination/Early Intervention  __School:                                                                         Grade:  __Other:   Diane Huber Cultural and Environment Information Cultural Issues Impacting Care: MOB is Spanish speaking.  FOB speaks some English  III. STRENGTHS _x__Supportive family/friends _x__Adequate Resources _x__Compliance with medical plan ___Home  prepared for Child (including basic supplies) _x__Understanding of illness      _x__Other: Pediatric follow up will be at [facility] IV. RISK FACTORS AND CURRENT PROBLEMS         __x__No Problems Noted                                                                                                                                                                                                                                       Pt              Family     Substance Abuse                                                                ___              ___        Mental Illness  ___              ___  Family/Relationship Issues                                      ___               ___             Abuse/Neglect/Domestic Violence                                         ___         ___  Financial Resources                                        ___              ___             Transportation                                                                        ___               ___  DSS Involvement                                                                   ___              ___  Adjustment to Illness                                                               ___              ___  Knowledge/Cognitive Deficit                                                   ___              ___             Compliance with Treatment                                                 ___                ___  Basic Needs (food, housing, etc.)                                          ___              ___             Housing Concerns                                       ___              ___ Other_____________________________________________________________            V. SOCIAL WORK ASSESSMENT SW met with parents in MOB's third floor room with assistance from Spanish interpreter, Diane Huber to introduce myself, complete assessment and  evaluate how they are coping with baby's premature birth and admission to NICU.  SW explained support services offered by NICU SWs.  Parents were very pleasant and seemed to appreciate talking with SW.  They report having good supports and coping well with NICU situation.  MOB is concerned about discharge and having to leave the baby here.  SW validated feelings and asked if they have transportation needs.   FOB states no issues with transportation.  Parents report feeling comfortable with care, but did not seem to have a very good understanding of what to expect from NICU course.  SW spoke in very general terms about what to expect and explained to them that SW could not provide any medical information specific to their baby.  SW informed them that they can ask for an interpreter at any time for updates on their baby from NICU staff.  Parents told SW that they have some supplies for baby, but they thought they had more time to get things together.  They think they will be able to get what they need.  SW asked them to let SW know of any needs in the future.  MOB has a son at home who is being cared for by uncles while MOB has been in the hospital.  Parents state no further questions or needs at this time.    VI. SOCIAL WORK PLAN  ___No Further Intervention Required/No Barriers to Discharge   _x__Psychosocial Support and Ongoing Assessment of Needs   ___Patient/Family Education:   ___Child Protective Services Report   County___________ Date___/____/____   ___Information/Referral to [employer]_________________________   ___Other:

## 2011-11-08 NOTE — Discharge Summary (Signed)
Obstetric Discharge Summary Reason for Admission: onset of labor Prenatal Procedures: Preeclampsia Intrapartum Procedures: cesarean: low cervical, transverse Postpartum Procedures: none Complications-Operative and Postpartum: none Hemoglobin  Date Value Range Status  11/07/2011 7.9* 12.0-15.0 (g/dL) Final     HCT  Date Value Range Status  11/07/2011 23.7* 36.0-46.0 (%) Final    Discharge Diagnoses: Preelampsia  Discharge Information: Date: 11/08/2011 Activity: pelvic rest Diet: routine Medications: Percocet Condition: stable Instructions: refer to practice specific booklet Discharge to: home Follow-up Information    Follow up with Marca Gadsby A, MD. Call in 6 weeks.   Contact information:   [address] [place] [address] [phone]          Newborn Data: Live born female  Birth Weight: 5 lb 6.1 oz (2440 g) APGAR: 4, 8  Home with mother.  Diane Huber 11/08/2011, 6:55 AM

## 2011-11-09 LAB — TYPE AND SCREEN
ABO/RH(D): O POS
Antibody Screen: NEGATIVE
Unit division: 0

## 2011-11-09 NOTE — Consult Note (Signed)
I met with mom briefly while Eda  , Spanish interpreter was there. Mom going to [facility] to get DEP , pumping frequency and duration reviewed. I will follow  this mom   and baby  In the NICU

## 2011-12-04 NOTE — Op Note (Signed)
Cesarean Section Procedure Note  Indications: severe preeclampsia.  HELLP Syndrome.  Unfavorable cervix.  Protracted latent phase of labor.  Pre-operative Diagnosis: 34 week 4 day pregnancy.  Post-operative Diagnosis: same  Surgeon: Krissie Merrick A   Assistants: SURG. Tech.  Anesthesia: General endotracheal anesthesia  ASA Class: 3   Procedure Details   The patient was seen in the Holding Room. The risks, benefits, complications, treatment options, and expected outcomes were discussed with the patient.  The patient concurred with the proposed plan, giving informed consent.  The site of surgery properly noted/marked. The patient was taken to Operating Room # 1, identified as Diane Huber and the procedure verified as C-Section Delivery. A Time Out was held and the above information confirmed.  After induction of anesthesia, the patient was draped and prepped in the usual sterile manner. A Pfannenstiel incision was made and carried down through the subcutaneous tissue to the fascia. Fascial incision was made and extended transversely. The fascia was separated from the underlying rectus tissue superiorly and inferiorly. The peritoneum was identified and entered. Peritoneal incision was extended longitudinally. The utero-vesical peritoneal reflection was incised transversely and the bladder flap was bluntly freed from the lower uterine segment. A low transverse uterine incision was made. Delivered from cephalic presentation with vacuum assistance because of hyper extension,  was a 2440 gram Female,  with Apgar scores of 4 at one minute and 8 at five minutes. After the umbilical cord was clamped and cut cord blood was obtained for evaluation. The placenta was removed intact and appeared normal. The uterine outline, tubes and ovaries appeared normal. The uterine incision was closed with running locked sutures of 0 Monocryl. Hemostasis was observed. Lavage was carried out until clear.  The  peritoneum closed with 2-0 Monocryl. The fascia was then reapproximated with running sutures of 0 Vicryl. The skin was reapproximated with Staples.  Instrument, sponge, and needle counts were correct prior the abdominal closure and at the conclusion of the case.   Findings: Viable female.  Estimated Blood Loss:           Drains: Foley to gravity.          Total IV Fluids:           Specimens: Placenta           Implants: none          Complications:  None; patient tolerated the procedure well.          Disposition: PACU - hemodynamically stable.          Condition: stable  Attending Attestation: I was present and scrubbed for the entire procedure.

## 2011-12-04 NOTE — Op Note (Deleted)
Cesarean Section Procedure Note  Indications: severe preeclampsia.  HELLP Syndrome.  Unfavorable cervix.  Protracted latent phase of labor.  Pre-operative Diagnosis: 34 week 4 day pregnancy.  Post-operative Diagnosis: same  Surgeon: Demondre Aguas A   Assistants: SURG. Tech.  Anesthesia: General endotracheal anesthesia  ASA Class: 3   Procedure Details   The patient was seen in the Holding Room. The risks, benefits, complications, treatment options, and expected outcomes were discussed with the patient.  The patient concurred with the proposed plan, giving informed consent.  The site of surgery properly noted/marked. The patient was taken to Operating Room # 1, identified as Syretta Tellez-Angel and the procedure verified as C-Section Delivery. A Time Out was held and the above information confirmed.  After induction of anesthesia, the patient was draped and prepped in the usual sterile manner. A Pfannenstiel incision was made and carried down through the subcutaneous tissue to the fascia. Fascial incision was made and extended transversely. The fascia was separated from the underlying rectus tissue superiorly and inferiorly. The peritoneum was identified and entered. Peritoneal incision was extended longitudinally. The utero-vesical peritoneal reflection was incised transversely and the bladder flap was bluntly freed from the lower uterine segment. A low transverse uterine incision was made. Delivered from cephalic presentation with vacuum assistance because of hyper extension,  was a 2440 gram Female,  with Apgar scores of 4 at one minute and 8 at five minutes. After the umbilical cord was clamped and cut cord blood was obtained for evaluation. The placenta was removed intact and appeared normal. The uterine outline, tubes and ovaries appeared normal. The uterine incision was closed with running locked sutures of 0 Monocryl. Hemostasis was observed. Lavage was carried out until clear.  The  peritoneum closed with 2-0 Monocryl. The fascia was then reapproximated with running sutures of 0 Vicryl. The skin was reapproximated with Staples.  Instrument, sponge, and needle counts were correct prior the abdominal closure and at the conclusion of the case.   Findings: Viable female.  Estimated Blood Loss:  1000ml          Drains: Foley to gravity.          Total IV Fluids:  800ml          Specimens: Placenta           Implants: none          Complications:  None; patient tolerated the procedure well.          Disposition: PACU - hemodynamically stable.          Condition: stable  Attending Attestation: I was present and scrubbed for the entire procedure.  

## 2013-05-17 ENCOUNTER — Emergency Department (INDEPENDENT_AMBULATORY_CARE_PROVIDER_SITE_OTHER)
Admission: EM | Admit: 2013-05-17 | Discharge: 2013-05-17 | Disposition: A | Discharge status: Home / Self Care | Payer: Self-pay | Source: Home / Self Care | Attending: Family Medicine | Admitting: Family Medicine

## 2013-05-17 ENCOUNTER — Encounter (HOSPITAL_COMMUNITY): Payer: Self-pay | Admitting: Emergency Medicine

## 2013-05-17 DIAGNOSIS — N10 Acute tubulo-interstitial nephritis: Secondary | ICD-10-CM

## 2013-05-17 MED ORDER — CEFTRIAXONE SODIUM 1 G IJ SOLR
INTRAMUSCULAR | Status: AC
  Filled 2013-05-17: qty 10

## 2013-05-17 MED ORDER — LIDOCAINE HCL (PF) 1 % IJ SOLN
INTRAMUSCULAR | Status: AC
  Filled 2013-05-17: qty 5

## 2013-05-17 MED ORDER — ONDANSETRON 4 MG PO TBDP
ORAL_TABLET | ORAL | Status: AC
  Filled 2013-05-17: qty 1

## 2013-05-17 MED ORDER — ONDANSETRON 4 MG PO TBDP
4.0000 mg | ORAL_TABLET | Freq: Once | ORAL | Status: AC
  Administered 2013-05-17: 4 mg via ORAL

## 2013-05-17 MED ORDER — ONDANSETRON HCL 4 MG PO TABS: 4.0000 mg | ORAL_TABLET | Freq: Four times a day (QID) | ORAL | Status: DC

## 2013-05-17 MED ORDER — CEFTRIAXONE SODIUM 1 G IJ SOLR
1.0000 g | Freq: Once | INTRAMUSCULAR | Status: AC
  Administered 2013-05-17: 1 g via INTRAMUSCULAR

## 2013-05-17 MED ORDER — CEPHALEXIN 500 MG PO CAPS: 500.0000 mg | ORAL_CAPSULE | Freq: Four times a day (QID) | ORAL | Status: DC

## 2013-05-17 NOTE — ED Notes (Signed)
Pt c/o UTI sxs onset 5 days Sxs include: back/abd pain, fevers, chills, nausea, urinary freq/urgency Denies: v/d, hematuria, vag d/c Alert w/no signs of acute distress

## 2013-05-17 NOTE — ED Provider Notes (Signed)
CSN: [id number]     Arrival date & time 05/17/13  1739 History   None    Chief Complaint  Patient presents with  . Urinary Tract Infection   (Consider location/radiation/quality/duration/timing/severity/associated sxs/prior Treatment) Patient is a 32 y.o. female presenting with abdominal pain. The history is provided by the patient. The history is limited by a language barrier. Language interpreter used: ramon translated.  Abdominal Pain This is a new problem. The current episode started more than 2 days ago. The problem has been gradually worsening. Associated symptoms include abdominal pain. Pertinent negatives include no chest pain.    History reviewed. No pertinent past medical history. Past Surgical History  Procedure Laterality Date  . Cesarean section  11/05/2011    Procedure: CESAREAN SECTION;  Surgeon: Brock Bad, MD;  Location: [facility];  Service: Gynecology;  Laterality: N/A;  Primary cesarean section of baby boy  at 51 APGAR 4/8 cord ph 7.33   Family History  Problem Relation Age of Onset  . Anesthesia problems Neg Hx   . Malignant hyperthermia Neg Hx   . Hypotension Neg Hx   . Pseudochol deficiency Neg Hx    History  Substance Use Topics  . Smoking status: Never Smoker   . Smokeless tobacco: Never Used  . Alcohol Use: No   OB History   Grav Para Term Preterm Abortions TAB SAB Ect Mult Living   4 2 1 1 2 1 1   1      Review of Systems  Constitutional: Positive for fever.  Cardiovascular: Negative for chest pain.  Gastrointestinal: Positive for nausea and abdominal pain. Negative for vomiting, diarrhea and constipation.  Genitourinary: Positive for dysuria, urgency, frequency and flank pain. Negative for hematuria, vaginal bleeding, vaginal discharge, menstrual problem and pelvic pain.  Musculoskeletal: Negative.   Skin: Negative.     Allergies  Review of patient's allergies indicates no known allergies.  Home Medications   Current Outpatient Rx   Name  Route  Sig  Dispense  Refill  . cephALEXin (KEFLEX) 500 MG capsule   Oral   Take 1 capsule (500 mg total) by mouth 4 (four) times daily. Take all of medicine and drink lots of fluids   20 capsule   0   . ondansetron (ZOFRAN) 4 MG tablet   Oral   Take 1 tablet (4 mg total) by mouth every 6 (six) hours.   6 tablet   0    BP 134/78  Pulse 113  Temp(Src) 102.1 F (38.9 C) (Oral)  Resp 18  SpO2 100%  LMP 04/22/2013  Breastfeeding? No Physical Exam  Constitutional: She is oriented to person, place, and time. She appears well-developed and well-nourished.  HENT:  Head: Normocephalic.  Right Ear: External ear normal.  Left Ear: External ear normal.  Mouth/Throat: Oropharynx is clear and moist.  Neck: Normal range of motion. Neck supple.  Cardiovascular: Normal rate and regular rhythm.   Pulmonary/Chest: Effort normal and breath sounds normal.  Abdominal: Soft. Bowel sounds are normal. She exhibits no distension. There is no hepatosplenomegaly. There is tenderness. There is CVA tenderness. There is no rigidity, no rebound and no guarding.  Lymphadenopathy:    She has no cervical adenopathy.  Neurological: She is alert and oriented to person, place, and time.  Skin: Skin is warm and dry. No rash noted.  Psychiatric: She has a normal mood and affect.    ED Course  Procedures (including critical care time) Labs Review Labs Reviewed  URINE CULTURE  POCT PREGNANCY, URINE   Imaging Review No results found.  MDM   1. Pyelonephritis, acute    U/a abnl.  Linna Hoff, MD 05/20/13 [phone]

## 2013-05-17 NOTE — ED Notes (Signed)
UA results not crossing over.  Results given to Dr Artis Flock

## 2013-05-19 LAB — URINE CULTURE

## 2013-05-20 NOTE — ED Notes (Signed)
Urine culture: >100,000 colonies E. Coli.  Pt. Adequately treated with Keflex. Diane Huber 05/20/2013

## 2014-07-20 ENCOUNTER — Encounter (HOSPITAL_COMMUNITY): Payer: Self-pay | Admitting: Emergency Medicine

## 2020-05-14 LAB — LAB REPORT - SCANNED: Pap: ABNORMAL — AB

## 2020-06-21 ENCOUNTER — Ambulatory Visit: Payer: Self-pay | Attending: Family Medicine | Admitting: Family Medicine

## 2020-06-21 ENCOUNTER — Encounter: Payer: Self-pay | Admitting: Family Medicine

## 2020-06-21 ENCOUNTER — Other Ambulatory Visit: Payer: Self-pay

## 2020-06-21 VITALS — BP 125/81 | HR 75 | Temp 98.0°F | Ht 59.0 in | Wt 147.4 lb

## 2020-06-21 DIAGNOSIS — R030 Elevated blood-pressure reading, without diagnosis of hypertension: Secondary | ICD-10-CM

## 2020-06-21 DIAGNOSIS — E663 Overweight: Secondary | ICD-10-CM

## 2020-06-21 DIAGNOSIS — Z13228 Encounter for screening for other metabolic disorders: Secondary | ICD-10-CM

## 2020-06-21 NOTE — Progress Notes (Signed)
Blood pressure has been high at her [facility].

## 2020-06-21 NOTE — Patient Instructions (Signed)
Plan de alimentacin DASH DASH Eating Plan DASH es la sigla en ingls de "Enfoques Alimentarios para Detener la Hipertensin" (Dietary Approaches to Stop Hypertension). El plan de alimentacin DASH ha demostrado bajar la presin arterial elevada (hipertensin). Tambin puede reducir el riesgo de diabetes tipo 2, enfermedad cardaca y accidente cerebrovascular. Este plan tambin puede ayudar a adelgazar. Consejos para seguir este plan  Pautas generales  Evite ingerir ms de 2,300 mg (miligramos) de sal (sodio) por da. Si tiene hipertensin, es posible que necesite reducir la ingesta de sodio a 1,500 mg por da.  Limite el consumo de alcohol a no ms de 1medida por da si es mujer y no est embarazada, y 2medidas por da si es hombre. Una medida equivale a 12oz (355ml) de cerveza, 5oz (148ml) de vino o 1oz (44ml) de bebidas alcohlicas de alta graduacin.  Trabaje con su mdico para mantener un peso saludable o perder peso. Pregntele cul es el peso recomendado para usted.  Realice al menos 30 minutos de ejercicio que haga que se acelere su corazn (ejercicio aerbico) la mayora de los das de la semana. Estas actividades pueden incluir caminar, nadar o andar en bicicleta.  Trabaje con su mdico o especialista en alimentacin y nutricin (nutricionista) para ajustar su plan alimentario a sus necesidades calricas personales. Lectura de las etiquetas de los alimentos   Verifique en las etiquetas de los alimentos, la cantidad de sodio por porcin. Elija alimentos con menos del 5 por ciento del valor diario de sodio. Generalmente, los alimentos con menos de 300 mg de sodio por porcin se encuadran dentro de este plan alimentario.  Para encontrar cereales integrales, busque la palabra "integral" como primera palabra en la lista de ingredientes. De compras  Compre productos en los que en su etiqueta diga: "bajo contenido de sodio" o "sin agregado de sal".  Compre alimentos frescos.  Evite los alimentos enlatados y comidas precocidas o congeladas. Coccin  Evite agregar sal cuando cocine. Use hierbas o aderezos sin sal, en lugar de sal de mesa o sal marina. Consulte al mdico o farmacutico antes de usar sustitutos de la sal.  No fra los alimentos. A la hora de cocinar los alimentos opte por hornearlos, hervirlos, grillarlos y asarlos a la parrilla.  Cocine con aceites cardiosaludables, como oliva, canola, soja o girasol. Planificacin de las comidas  Consuma una dieta equilibrada, que incluya lo siguiente: ? 5o ms porciones de frutas y verduras por da. Trate de que la mitad del plato de cada comida sean frutas y verduras. ? Hasta 6 u 8 porciones de cereales integrales por da. ? Menos de 6 onzas de carne, aves o pescado magros por da. Una porcin de 3 onzas de carne tiene casi el mismo tamao que un mazo de cartas. Un huevo equivale a 1 onza. ? Dos porciones de productos lcteos descremados por da. ? Una porcin de frutos secos, semillas o frijoles 5 veces por semana. ? Grasas cardiosaludables. Las grasas saludables llamadas cidos grasos omega-3 se encuentran en alimentos como semillas de lino y pescados de agua fra, como por ejemplo, sardinas, salmn y caballa.  Limite la cantidad que ingiere de los siguientes alimentos: ? Alimentos enlatados o envasados. ? Alimentos con alto contenido de grasa trans, como alimentos fritos. ? Alimentos con alto contenido de grasa saturada, como carne con grasa. ? Dulces, postres, bebidas azucaradas y otros alimentos con azcar agregada. ? Productos lcteos enteros.  No le agregue sal a los alimentos antes de probarlos.  Trate de comer   al menos 2 comidas vegetarianas por semana.  Consuma ms comida casera y menos de restaurante, de bufs y comida rpida.  Cuando coma en un restaurante, pida que preparen su comida con menos sal o, en lo posible, sin nada de sal. Qu alimentos se recomiendan? Los alimentos enumerados a  continuacin no constituyen una lista completa. Hable con el nutricionista sobre las mejores opciones alimenticias para usted. Cereales Pan de salvado o integral. Pasta de salvado o integral. Arroz integral. Avena. Quinua. Trigo burgol. Cereales integrales y con bajo contenido de sodio. Pan pita. Galletitas de agua con bajo contenido de grasa y sodio. Tortillas de harina integral. Verduras Verduras frescas o congeladas (crudas, al vapor, asadas o grilladas). Jugos de tomate y verduras con bajo contenido de sodio o reducidos en sodio. Salsa y pasta de tomate con bajo contenido de sodio o reducidas en sodio. Verduras enlatadas con bajo contenido de sodio o reducidas en sodio. Frutas Todas las frutas frescas, congeladas o disecadas. Frutas enlatadas en jugo natural (sin agregado de azcar). Carne y otros alimentos proteicos Pollo o pavo sin piel. Carne de pollo o de pavo molida. Cerdo desgrasado. Pescado y mariscos. Claras de huevo. Porotos, guisantes o lentejas secos. Frutos secos, mantequilla de frutos secos y semillas sin sal. Frijoles enlatados sin sal. Cortes de carne vacuna magra, desgrasada. Embutidos magros, con bajo contenido de sodio. Lcteos Leche descremada (1%) o descremada. Quesos sin grasa, con bajo contenido de grasa o descremados. Queso blanco o ricota sin grasa, con bajo contenido de sodio. Yogur semidescremado o descremado. Queso con bajo contenido de grasa y sodio. Grasas y aceites Margarinas untables que no contengan grasas trans. Aceite vegetal. Mayonesa y aderezos para ensaladas livianos o con bajo contenido de grasas (reducidos en sodio). Aceite de canola, crtamo, oliva, soja y girasol. Aguacate. Condimentos y otros alimentos Hierbas. Especias. Mezclas de condimentos sin sal. Palomitas de maz y pretzels sin sal. Dulces con bajo contenido de grasas. Qu alimentos no se recomiendan? Los alimentos enumerados a continuacin no constituyen una lista completa. Hable con el  nutricionista sobre las mejores opciones alimenticias para usted. Cereales Productos de panificacin hechos con grasa, como medialunas, magdalenas y algunos panes. Comidas con arroz o pasta seca listas para usar. Verduras Verduras con crema o fritas. Verduras en salsa de queso. Verduras enlatadas regulares (que no sean con bajo contenido de sodio o reducidas en sodio). Pasta y salsa de tomates enlatadas regulares (que no sean con bajo contenido de sodio o reducidas en sodio). Jugos de tomate y verduras regulares (que no sean con bajo contenido de sodio o reducidos en sodio). Pepinillos. Aceitunas. Frutas Fruta enlatada en almbar liviano o espeso. Frutas cocidas en aceite. Frutas con salsa de crema o manteca. Carne y otros alimentos proteicos Cortes de carne con grasa. Costillas. Carne frita. Tocino. Salchichas. Mortadela y otras carnes procesadas. Salame. Panceta. Perros calientes (hotdogs). Salchicha de cerdo. Frutos secos y semillas con sal. Frijoles enlatados con agregado de sal. Pescado enlatado o ahumado. Huevos enteros o yemas. Pollo o pavo con piel. Lcteos Leche entera o al 2%, crema y mitad leche y mitad crema. Queso crema entero o con toda su grasa. Yogur entero o endulzado. Quesos con toda su grasa. Sustitutos de cremas no lcteas. Coberturas batidas. Quesos para untar y quesos procesados. Grasas y aceites Mantequilla. Margarina en barra. Manteca de cerdo. Materia grasa. Mantequilla clarificada. Grasa de panceta. Aceites tropicales como aceite de coco, palmiste o palma. Condimentos y otros alimentos Palomitas de maz y pretzels con sal. Sal   de cebolla, sal de ajo, sal condimentada, sal de mesa y sal marina. Salsa Worcestershire. Salsa trtara. Salsa barbacoa. Salsa teriyaki. Salsa de soja, incluso la que tiene contenido reducido de sodio. Salsa de carne. Salsas en lata y envasadas. Salsa de pescado. Salsa de ostras. Salsa rosada. Rbano picante envasado. [place]. Mostaza. Saborizantes y  tiernizantes para carne. Caldo en cubitos. Salsa picante y salsa tabasco. Escabeches envasados o ya preparados. Aderezos para tacos prefabricados o envasados. Salsas. Aderezos comunes para ensalada. Dnde encontrar ms informacin:  Instituto Nacional del Coraz[place], los Pulmones y la Sangre (National Heart, Lung, and Blood Institute): www.nhlbi.nih.gov  Asoci[employer] del [place] (American Heart Association): www.heart.org Resumen  El plan de alimentacin DASH ha demostrado bajar la presin arterial elevada (hipertensin). Tambin puede reducir el riesgo de diabetes tipo 2, enfermedad cardaca y accidente cerebrovascular.  Con el plan de alimentacin DASH, deber limitar el consumo de sal (sodio) a 2,300 mg por da. Si tiene hipertensin, es posible que necesite reducir la ingesta de sodio a 1,500 mg por da.  Cuando siga el plan de alimentacin DASH, trate de comer ms frutas frescas y verduras, cereales integrales, carnes magras, lcteos descremados y grasas cardiosaludables.  Trabaje con su mdico o especialista en alimentacin y nutricin (nutricionista) para ajustar su plan alimentario a sus necesidades calricas personales. Esta informacin no tiene como fin reemplazar el consejo del mdico. Asegrese de hacerle al mdico cualquier pregunta que tenga. Document Revised: 12/25/2016 Document Reviewed: 12/25/2016 Elsevier Patient Education  2020 [employer]  

## 2020-06-21 NOTE — Progress Notes (Signed)
Name: Diane Huber   MRN: [id number]    DOB: 1981/05/23   Date:06/21/2020       Progress Note  Subjective  Chief Complaint  Chief Complaint  Patient presents with  . New Patient (Initial Visit)    HPI (Done with interpreter services)  Diane Huber is a 39 year old female being seen today to establish care. A month ago, she went to OBGYN because she is trying to get pregant and found out blood was elevated at 147/95, so they referred her to the clinic for blood pressure management. Since then, she has significantly reduced her sodium intake. She has researched about which foods are high in sodium and have limited them. She has cut off coffee and sodas from her diet. She does not exercise. She does not use tobacco products. She has been checking her blood pressure at [facility] with blood pressure readings: 132/89, 122/88, 127/89. Her next appointment with OBGYN is in November. She has no other concerns today.  PHQ2/9: Depression screen PHQ 2/9 06/21/2020  Decreased Interest 0  Down, Depressed, Hopeless 0  PHQ - 2 Score 0  Altered sleeping 0  Tired, decreased energy 1  Change in appetite 0  Feeling bad or failure about yourself  0  Trouble concentrating 0  Moving slowly or fidgety/restless 0  Suicidal thoughts 0  PHQ-9 Score 1    There are no problems to display for this patient.   No past medical history on file.  Past Surgical History:  Procedure Laterality Date  . CESAREAN SECTION  11/05/2011   Procedure: CESAREAN SECTION;  Surgeon: Shelly Bombard, MD;  Location: [facility];  Service: Gynecology;  Laterality: N/A;  Primary cesarean section of baby boy  at [facility] 4/8 cord ph 7.33    Social History   Tobacco Use  . Smoking status: Never Smoker  . Smokeless tobacco: Never Used  Substance Use Topics  . Alcohol use: No     Current Outpatient Medications:  .  cephALEXin (KEFLEX) 500 MG capsule, Take 1 capsule (500 mg total) by mouth 4 (four) times daily. Take  all of medicine and drink lots of fluids (Patient not taking: Reported on 06/21/2020), Disp: 20 capsule, Rfl: 0 .  ondansetron (ZOFRAN) 4 MG tablet, Take 1 tablet (4 mg total) by mouth every 6 (six) hours. (Patient not taking: Reported on 06/21/2020), Disp: 6 tablet, Rfl: 0  No Known Allergies  Review of Systems  Constitutional: Negative for chills, fever and malaise/fatigue.  Eyes: Negative for blurred vision, double vision and pain.  Respiratory: Negative for cough and shortness of breath.   Cardiovascular: Negative for chest pain, palpitations and leg swelling.  Gastrointestinal: Negative for diarrhea, nausea and vomiting.  Neurological: Negative for dizziness, tingling and focal weakness.  Psychiatric/Behavioral: Negative for depression and suicidal ideas. The patient is not nervous/anxious.     No other specific complaints in a complete review of systems (except as listed in HPI above).  Objective  Vitals:   06/21/20 0915  BP: 125/81  Pulse: 75  Temp: 98 F (36.7 C)  TempSrc: Oral  SpO2: 100%  Weight: 147 lb 6.4 oz (66.9 kg)  Height: $Remove'4\' 11"'mBlFAwk$  (1.499 m)    Body mass index is 29.77 kg/m.  Nursing Note and Vital Signs reviewed.  Physical Exam Constitutional:      Appearance: Normal appearance.  HENT:     Head: Normocephalic and atraumatic.  Cardiovascular:     Rate and Rhythm: Normal rate and regular rhythm.  Pulses: Normal pulses.     Heart sounds: Normal heart sounds. No murmur heard.   Pulmonary:     Effort: Pulmonary effort is normal. No respiratory distress.     Breath sounds: Normal breath sounds. No wheezing, rhonchi or rales.  Abdominal:     General: Bowel sounds are normal.     Palpations: Abdomen is soft.     Tenderness: There is no abdominal tenderness.  Musculoskeletal:     Right lower leg: No edema.     Left lower leg: No edema.  Neurological:     Mental Status: She is alert and oriented to person, place, and time.     Gait: Gait is intact.   Psychiatric:        Attention and Perception: Attention and perception normal.        Mood and Affect: Mood normal.        Behavior: Behavior normal.      No results found for this or any previous visit (from the past 48 hour(s)).  Assessment & Plan 1. Transient elevated blood pressure Blood pressure today is 125/81 and controlled with lifestyle modification. Used shared decision-making with patient to discuss next step. Educated about the use of low-dose labetalol for blood pressure management versus lifestyle management to control blood pressure. Patient decided on lifestyle modifications to control blood pressure. Education provided about 150 minutes of moderate-intensity exercise weekly, weight loss, and low-sodium diet. Advised patient to continue to check blood pressures and notify provider if they start to get elevated.  2. Screening for metabolic disorder Labs ordered to assess her overall health as she is a newly established patient. Patient will be called with results of her lab work. - CMP14+EGFR - CBC with Differential/Platelet - Hemoglobin A1c - TSH - T4, free  3. Overweight (BMI 25.0-29.9) Provided education about weight loss, which will reduce blood pressure and increase her chances of pregnancy. Patient will try to increase her exercise and continue to improve her diet to aid in weight loss.  Return in about 6 months (around 12/20/2020) for chronic disease management.    Evaluation and management procedures were performed by me with NP Student in attendance, note written by NP student under my supervision and collaboration. I have reviewed the note and I agree with the management and plan.  Diane Huber currently has a well-controlled blood pressure which will be managed with lifestyle modifications after joint decision-making in the clinic today and she will continue to keep a record of her ambulatory blood pressures.  Advised on daily intake of folic acid and  multivitamins for preconceptive care.  Charlott Rakes, MD, FAAFP. [facility] and [place], [place]   06/21/2020, 11:45 AM

## 2020-06-22 LAB — CBC WITH DIFFERENTIAL/PLATELET
Basophils Absolute: 0 10*3/uL (ref 0.0–0.2)
Basos: 1 %
EOS (ABSOLUTE): 0.1 10*3/uL (ref 0.0–0.4)
Eos: 1 %
Hematocrit: 45.2 % (ref 34.0–46.6)
Hemoglobin: 14.9 g/dL (ref 11.1–15.9)
Immature Grans (Abs): 0 10*3/uL (ref 0.0–0.1)
Immature Granulocytes: 0 %
Lymphocytes Absolute: 2.3 10*3/uL (ref 0.7–3.1)
Lymphs: 30 %
MCH: 31 pg (ref 26.6–33.0)
MCHC: 33 g/dL (ref 31.5–35.7)
MCV: 94 fL (ref 79–97)
Monocytes Absolute: 0.6 10*3/uL (ref 0.1–0.9)
Monocytes: 8 %
Neutrophils Absolute: 4.4 10*3/uL (ref 1.4–7.0)
Neutrophils: 60 %
Platelets: 264 10*3/uL (ref 150–450)
RBC: 4.8 x10E6/uL (ref 3.77–5.28)
RDW: 13.1 % (ref 11.7–15.4)
WBC: 7.4 10*3/uL (ref 3.4–10.8)

## 2020-06-22 LAB — CMP14+EGFR
ALT: 42 IU/L — ABNORMAL HIGH (ref 0–32)
AST: 21 IU/L (ref 0–40)
Albumin/Globulin Ratio: 1.6 (ref 1.2–2.2)
Albumin: 4.7 g/dL (ref 3.8–4.8)
Alkaline Phosphatase: 79 IU/L (ref 44–121)
BUN/Creatinine Ratio: 19 (ref 9–23)
BUN: 11 mg/dL (ref 6–20)
Bilirubin Total: 0.4 mg/dL (ref 0.0–1.2)
CO2: 22 mmol/L (ref 20–29)
Calcium: 9.9 mg/dL (ref 8.7–10.2)
Chloride: 103 mmol/L (ref 96–106)
Creatinine, Ser: 0.58 mg/dL (ref 0.57–1.00)
GFR calc Af Amer: 136 mL/min/{1.73_m2} (ref >59)
GFR calc non Af Amer: 118 mL/min/{1.73_m2} (ref >59)
Globulin, Total: 3 g/dL (ref 1.5–4.5)
Glucose: 96 mg/dL (ref 65–99)
Potassium: 4.4 mmol/L (ref 3.5–5.2)
Sodium: 138 mmol/L (ref 134–144)
Total Protein: 7.7 g/dL (ref 6.0–8.5)

## 2020-06-22 LAB — TSH: TSH: 1.07 u[IU]/mL (ref 0.450–4.500)

## 2020-06-22 LAB — HEMOGLOBIN A1C
Est. average glucose Bld gHb Est-mCnc: 123 mg/dL
Hgb A1c MFr Bld: 5.9 % — ABNORMAL HIGH (ref 4.8–5.6)

## 2020-06-22 LAB — T4, FREE: Free T4: 1.27 ng/dL (ref 0.82–1.77)

## 2021-01-20 ENCOUNTER — Ambulatory Visit: Payer: Self-pay | Admitting: Physician Assistant

## 2021-01-20 ENCOUNTER — Other Ambulatory Visit: Payer: Self-pay

## 2021-01-20 VITALS — BP 137/91 | HR 79 | Temp 98.2°F | Resp 18 | Ht 60.0 in | Wt 147.0 lb

## 2021-01-20 DIAGNOSIS — Z87898 Personal history of other specified conditions: Secondary | ICD-10-CM

## 2021-01-20 DIAGNOSIS — I10 Essential (primary) hypertension: Secondary | ICD-10-CM | POA: Insufficient documentation

## 2021-01-20 DIAGNOSIS — Z3202 Encounter for pregnancy test, result negative: Secondary | ICD-10-CM

## 2021-01-20 DIAGNOSIS — O1494 Unspecified pre-eclampsia, complicating childbirth: Secondary | ICD-10-CM | POA: Insufficient documentation

## 2021-01-20 DIAGNOSIS — R5383 Other fatigue: Secondary | ICD-10-CM | POA: Insufficient documentation

## 2021-01-20 DIAGNOSIS — Z1159 Encounter for screening for other viral diseases: Secondary | ICD-10-CM

## 2021-01-20 HISTORY — DX: Essential (primary) hypertension: I10

## 2021-01-20 LAB — POCT GLYCOSYLATED HEMOGLOBIN (HGB A1C): Hemoglobin A1C: 5.1 % (ref 4.0–5.6)

## 2021-01-20 LAB — POCT URINE PREGNANCY: Preg Test, Ur: NEGATIVE

## 2021-01-20 MED ORDER — HYDROCHLOROTHIAZIDE 25 MG PO TABS
25.0000 mg | ORAL_TABLET | Freq: Every day | ORAL | 1 refills | Status: DC
  Filled 2021-01-20: qty 30, 30d supply, fill #0

## 2021-01-20 NOTE — Patient Instructions (Signed)
You will start taking hydrochlorothiazide 25 mg once daily in the morning.  I do encourage you to obtain a blood pressure cuff and check your blood pressure on a daily basis at home, keep a written log and have available for all office visits.  I do encourage you to return to the mobile medicine unit in 3 to 4 weeks for review of your blood pressure readings.   We will call you with your lab results.    Please let us know if there is anything else we can do for you  Roney Jaffe, PA-C Physician Assistant [facility] Mobile Medicine [email or web address]    Cmo tomarse la presin arterial How to Take Your Blood Pressure La presin arterial es la medida de la fuerza de la sangre al presionar contra las paredes de las arterias. Las arterias son los vasos sanguneos que transportan la sangre desde el corazn hacia todas las partes del cuerpo. Su mdico toma su presin arterial en cada visita al consultorio. Usted tambin puede tomarse la presin arterial en casa con un tensimetro. Es posible que deba tomarse la presin arterial:  Para confirmar un diagnstico de presin arterial elevada (hipertensin).  Para vigilar su presin arterial a lo largo del tiempo.  Para asegurarse de que el medicamento que toma para la presin arterial est surtiendo Engineer, mining. Materiales necesarios:  Monitor para medir la presin arterial.  Silla de comedor para sentarse.  Mesa o escritorio.  Cuaderno pequeo y lpiz o bolgrafo. Cmo prepararse Para obtener la lectura ms precisa, evite realizar lo siguiente durante los 30 minutos previos a Chief Operating Officer su presin arterial:  Beber cafena.  Consumir alcohol.  Comer.  Fumar.  Realizar actividad fsica. Cinco minutos antes de controlar su presin arterial:  Vaya al bao y orine para tener la vejiga vaca.  Sintese tranquilamente en una silla de comedor. No se siente en un silln blando o sof. No  hable. Cmo tomarse la presin arterial Para controlar su presin arterial, siga las instrucciones presentes en el manual que se incluye con el tensimetro. Si tiene Ambulance person, las instrucciones podran ser las siguientes: 1. Sintese derecho en una silla. 2. Coloque los pies en el piso. No cruce los tobillos ni las piernas. 3. Apoye su brazo izquierdo al nivel de su corazn en una mesa o escritorio, o en el brazo de la silla. 4. Arremnguese. 5. Envuelva la parte superior de su brazo izquierdo, 1 pulgada (2,5 cm) sobre su codo, con el brazalete. Es mejor Optometrist brazalete alrededor de la piel [place]. 6. Ajuste el brazalete alrededor de su brazo. Debe poder meter nicamente un dedo entre el brazalete y Cabin crew. 7. Coloque el cordn de modo que quede apoyado en el pliegue del codo. 8. Presione el botn de encendido. 9. Permanezca sentado tranquilamente mientras el brazalete se infla y se desinfla. 10. Lea la lectura digital que aparece en la pantalla del tensimetro y anote los nmeros (regstrelos) en un cuaderno. 11. Espere de 2a3 minutos, y luego repita los pasos desde el paso 1.   Qu significa mi lectura de presin arterial? Una lectura de la presin arterial consta de un nmero ms alto sobre un nmero ms bajo. En condiciones ideales, la presin arterial debe estar por debajo de 120/80. El primer nmero ("superior") es la presin sistlica. Es la medida de la presin de las arterias cuando el corazn late. El segundo nmero ("inferior") es la presin diastlica. Es la medida de la presin en las arterias cuando  el corazn se relaja. La presin arterial se clasifica en cinco etapas. Las siguientes son las etapas para adultos que no tienen enfermedad grave de corto plazo o una afeccin crnica. La presin sistlica y la presin diastlica se miden en una unidad llamada mm Hg (milmetros de mercurio).  Normal  Presin sistlica: por debajo de 120.  Presin diastlica:  por debajo de 80. Elevada  Presin sistlica: 120-129.  Presin diastlica: por debajo de 80. Etapa 1 de hipertensin  Presin sistlica: 130-139.  Presin diastlica: 80-89. Etapa 2 de hipertensin  Presin sistlica: 140 o ms.  Presin diastlica: 90 o ms. Puede tener presin arterial elevada o hipertensin incluso si nicamente el nmero sistlico o el diastlico de su lectura es ms elevado de lo normal. Siga estas instrucciones en su casa:  Mida su presin arterial con la frecuencia recomendada por su mdico.  Contrlese la presin arterial a la [employer].  Lleve el tensimetro a su prxima cita con el mdico para asegurarse de lo siguiente: ? Que lo [place] correctamente. ? Que genera lecturas precisas.  Asegrese de entender cules son sus objetivos para la presin arterial.  Dgale a su mdico si tiene efectos secundarios causados por los medicamentos para la presin arterial.  Concurra a todas las visitas de seguimiento como se lo haya indicado el mdico. Esto es importante. Consejos generales  Su mdico puede sugerirle un tensimetro confiable que cumpla con sus necesidades. Existen varios tipos de tensimetros para Cabin crew.  Escoja un tensimetro que tenga un brazalete. No escoja un tensimetro que mida su presin arterial en la mueca o el dedo.  Escoja un brazalete que envuelva ceidamente la parte superior de su brazo. Debe poder meter nicamente un dedo entre el brazalete y Cabin crew.  Puede comprar un tensimetro en la mayora de las farmacias o en lnea. Dnde buscar ms informacin American Heart Association (Asociacin Estadounidense del Corazn): www.heart.org Comunquese con un mdico si:  Su presin arterial es sistemticamente alta. Solicite ayuda de inmediato si:  Su presin arterial sistlica est por encima de 180.  Su presin arterial diastlica est por encima de 120. Resumen  La presin arterial es la medida de la  fuerza de la sangre al presionar contra las paredes de las arterias.  Una lectura de la presin arterial consta de un nmero ms alto sobre un nmero ms bajo. En condiciones ideales, la presin arterial debe estar por debajo de 120/80.  Contrlese la presin arterial a la [employer].  Evite la cafena, el alcohol, fumar y hacer actividad fsica durante los 30 minutos anteriores a controlarse la presin arterial. Estos factores pueden afectar la precisin de la lectura de la presin arterial. Esta informacin no tiene Theme park manager el consejo del mdico. Asegrese de hacerle al mdico cualquier pregunta que tenga. Document Revised: 10/13/2019 Document Reviewed: 10/13/2019 Elsevier Patient Education  2021 Elsevier Inc.   Hipertensin en los adultos Hypertension, Adult La presin arterial alta (hipertensin) se produce cuando la fuerza de la sangre bombea a travs de las arterias con mucha fuerza. Las arterias son los vasos sanguneos que transportan la sangre desde el corazn al resto del cuerpo. La hipertensin hace que el corazn haga ms esfuerzo para Insurance account manager y [employer] que las arterias se Armed forces training and education officer o Multimedia programmer. La hipertensin no tratada o no controlada puede causar infarto de miocardio, insuficiencia cardaca, accidente cerebrovascular, enfermedad renal y otros problemas. Una lectura de la presin arterial consta de  un nmero ms alto sobre un nmero ms bajo. En condiciones ideales, la presin arterial debe estar por debajo de 120/80. El primer nmero ("superior") es la presin sistlica. Es la medida de la presin de las arterias cuando el corazn late. El segundo nmero ("inferior") es la presin diastlica. Es la medida de la presin en las arterias cuando el corazn se relaja. Cules son las causas? Se desconoce la causa exacta de esta afeccin. Hay algunas afecciones que causan presin arterial alta o estn relacionadas con ella. Qu incrementa el  riesgo? Algunos factores de riesgo de hipertensin estn bajo su control. Los siguientes factores pueden hacer que sea ms propenso a Aeronautical engineer afeccin:  Fumar.  Tener diabetes mellitus tipo 2, colesterol alto, o ambos.  No hacer la cantidad suficiente de actividad fsica o ejercicio.  Tener sobrepeso.  Consumir mucha grasa, azcar, caloras o sal (sodio) en su dieta.  Beber alcohol en exceso. Algunos factores de riesgo para la presin arterial alta pueden ser difciles o imposibles de Multimedia programmer. Algunos de estos factores son los siguientes:  Tener enfermedad renal crnica.  Tener antecedentes familiares de presin arterial alta.  Edad. Los riesgos aumentan con la edad.  Raza. El riesgo es mayor para las Statistician.  Sexo. Antes de los 45aos, los hombres corren ms [employer]. Despus de los 65aos, las mujeres corren ms [employer].  Tener apnea obstructiva del sueo.  Estrs. Cules son los signos o los sntomas? Es posible que la presin arterial alta puede no cause sntomas. La presin arterial muy alta (crisis hipertensiva) puede provocar:  Dolor de cabeza.  Ansiedad.  Falta de aire.  Hemorragia nasal.  Nuseas y vmitos.  Cambios en la visin.  Dolor de pecho intenso.  Convulsiones. Cmo se diagnostica? Esta afeccin se diagnostica al medir su presin arterial mientras se encuentra sentado, con el brazo apoyado sobre una superficie plana, las piernas sin cruzar y los pies bien apoyados en el piso. El brazalete del tensimetro debe colocarse directamente sobre la piel de la parte superior del brazo y al nivel de su corazn. Debe medirla al [facility] veces en el mismo brazo. Determinadas condiciones pueden causar una diferencia de presin arterial entre el brazo izquierdo y Aeronautical engineer. Ciertos factores pueden provocar que las lecturas de la presin arterial sean inferiores o superiores a lo normal por un perodo  corto de tiempo:  Si su presin arterial es ms alta cuando se encuentra en el consultorio del mdico que cuando la mide en su hogar, se denomina "hipertensin de bata blanca". La Harley[employer]son de las personas que tienen esta afeccin no deben ser [employer].  Si su presin arterial es ms alta en el hogar que cuando se encuentra en el consultorio del mdico, se denomina "hipertensin enmascarada". La Har[employer]son de las personas que tienen esta afeccin deben ser medicadas para Chief Operating Officer la presin arterial. Si tiene una lecturas de presin arterial alta durante una visita o si tiene presin arterial normal con otros factores de riesgo, se le podr pedir que haga lo siguiente:  Que regrese otro da para volver a Chief Operating Officer su presin arterial nuevamente.  Que se controle la presin arterial en su casa durante 1 semana o ms. Si se le diagnostica hipertensin, es posible que se le realicen otros anlisis de sangre o estudios de diagnstico por imgenes para ayudar a su mdico a comprender su riesgo general de tener otras afecciones. Cmo se trata? Esta afeccin se trata haciendo cambios saludables en  el estilo de vida, tales como ingerir alimentos saludables, realizar ms ejercicio y reducir el consumo de alcohol. El mdico puede recetarle medicamentos si los cambios en el estilo de vida no son suficientes para Museum/gallery curator la presin arterial y si:  Su presin arterial sistlica est por encima de 130.  Su presin arterial diastlica est por encima de 80. La presin arterial deseada puede variar en funcin de las enfermedades, la edad y otros factores personales. Siga estas instrucciones en su casa: Comida y bebida  Siga una dieta con alto contenido de fibras y [place], y con bajo contenido de sodio, International aid/development worker agregada y Neurosurgeon. Un ejemplo de plan alimenticio es la dieta DASH (Dietary Approaches to Stop Hypertension, Mtodos alimenticios para detener la hipertensin). Para alimentarse de esta  manera: ? Coma mucha fruta y verdura fresca. Trate de que la mitad del plato de cada comida sea de frutas y verduras. ? Coma cereales integrales, como pasta integral, arroz integral o pan integral. Llene aproximadamente un cuarto del plato con cereales integrales. ? Coma y beba productos lcteos con bajo contenido de grasa, como leche descremada o yogur bajo en grasas. ? Evite la ingesta de cortes de carne grasa, carne procesada o curada, y carne de ave con piel. Llene aproximadamente un cuarto del plato con protenas magras, como pescado, pollo sin piel, frijoles, huevos o tofu. ? Evite ingerir alimentos prehechos y procesados. En general, estos tienen mayor cantidad de sodio, azcar agregada y Ste[place]anie [place].  Reduzca su ingesta diaria de sodio. La mayora de las personas que tienen hipertensin deben comer menos de 1500 mg de sodio por [employer].  No beba alcohol si: ? Su mdico le indica no hacerlo. ? Est embarazada, puede estar embarazada o est tratando de quedar embarazada.  Si bebe alcohol: ? Limite la cantidad que bebe a lo siguiente:  De 0 a 1 medida por da para las mujeres.  De 0 a 2 medidas por da para los hombres. ? Est atento a la cantidad de alcohol que hay en las bebidas que toma. En los [place], una medida equivale a una botella de cerveza de 12oz ( ), un vaso de vino de 5oz ( ) o un vaso de una bebida alcohlica de alta graduacin de 1oz (44ml).   Estilo de vida  Trabaje con su mdico para mantener un peso saludable o Curator. Pregntele cul es el peso recomendado para usted.  Haga al menos de ejercicio la DIRECTV de la [place]. Estas actividades pueden incluir caminar, nadar o andar en bicicleta.  Incluya ejercicios para fortalecer sus msculos (ejercicios de resistencia), como Pilates o levantamiento de pesas, como parte de su rutina semanal de ejercicios. Intente realizar de este tipo de ejercicios al Kellogg a la  [place].  No consuma ningn producto que contenga nicotina o tabaco, como cigarrillos, cigarrillos electrnicos y tabaco de Theatre manager. Si necesita ayuda para dejar de fumar, consulte al mdico.  Contrlese la presin arterial en su casa segn las indicaciones del mdico.  Concurra a todas las visitas de seguimiento como se lo haya indicado el mdico. Esto es importante.   Medicamentos  [employer] de venta libre y los recetados solamente como se lo haya indicado el mdico. Siga cuidadosamente las indicaciones. Los medicamentos para la presin arterial deben tomarse segn las indicaciones.  No omita las dosis de medicamentos para la presin arterial. Si lo hace, estar en riesgo de tener problemas y puede hacer que los medicamentos sean  menos eficaces.  Pregntele a su mdico a qu efectos secundarios o reacciones a los Museum/gallery curatormedicamentos debe prestar atencin. Comunquese con un mdico si:  Piensa que tiene una reaccin a un medicamento que est tomando.  Tiene dolores de cabeza frecuentes (recurrentes).  Se siente mareado.  Tiene hinchazn en los tobillos.  Tiene problemas de visin. Solicite ayuda inmediatamente si:  Siente un dolor de cabeza intenso o confusin.  Siente debilidad inusual o adormecimiento.  Siente que va a desmayarse.  Siente un dolor intenso en el pecho o el abdomen.  Vomita repetidas veces.  Tiene dificultad para respirar. Resumen  La hipertensin se produce cuando la sangre bombea en las arterias con mucha fuerza. Si esta afeccin no se controla, podra correr riesgo de tener complicaciones graves.  La presin arterial deseada puede variar en funcin de las enfermedades, la edad y otros factores personales. Para la Franklin Resourcesmayora de las personas, una presin arterial normal es menor que 120/80.  La hipertensin se trata con cambios en el estilo de vida, medicamentos o una combinacin de Rainbow Lakesambos. Los Danaher Corporationcambios en el estilo de vida incluyen prdida de peso,  ingerir alimentos sanos, seguir una dieta baja en sodio, hacer ms ejercicio y Glass blower/designerlimitar el consumo de alcohol. Esta informacin no tiene Theme park managercomo fin reemplazar el consejo del mdico. Asegrese de hacerle al mdico cualquier pregunta que tenga. Document Revised: 06/20/2018 Document Reviewed: 06/20/2018 Elsevier Patient Education  2021 [employer]vier Inc.

## 2021-01-20 NOTE — Progress Notes (Signed)
Patient has not taken medication, patient has eaten today. Patient checked her pressure and it was slighted elevated. Patient reports feeling irritated/ agitated and reports feeling like this yesterday. Patient feels fatigue " as if I have been running for sport" Patient denies dizziness and has intermittent HA in the back. 146/90- second check

## 2021-01-20 NOTE — Progress Notes (Signed)
Established Patient Office Visit  Subjective:  Patient ID: Diane Huber, female    DOB: 11-27-1980  Age: 40 y.o. MRN: [id number]  CC:  Chief Complaint  Patient presents with  . Hypertension    At home    HPI Diane Huber states that she has been checking her blood pressure at [facility] and states that she has been having elevated readings.  States that does not have a cuff at home   States that she has been having elevated readings every time she goes to [employer].   Readings similar to today.  States that she has been feeling fatigued and worn down for the pas two weeks. States that she is sleeping well.  States that she drinks a good amount of water. States that her stress level is low.   Does feel palpitations, mostly at night, not every night.   Does not use birth control, last menses was December 17, 2020. States IUD removed   Due to language barrier, an interpreter was present during the history-taking and subsequent discussion (and for part of the physical exam) with this patient.  History reviewed. No pertinent past medical history.  Past Surgical History:  Procedure Laterality Date  . CESAREAN SECTION  11/05/2011   Procedure: CESAREAN SECTION;  Surgeon: Brock Bad, MD;  Location: [facility];  Service: Gynecology;  Laterality: N/A;  Primary cesarean section of baby boy  at 80 APGAR 4/8 cord ph 7.33    Family History  Problem Relation Age of Onset  . Anesthesia problems Neg Hx   . Malignant hyperthermia Neg Hx   . Hypotension Neg Hx   . Pseudochol deficiency Neg Hx     Social History   Socioeconomic History  . Marital status: Single    Spouse name: Not on file  . Number of children: Not on file  . Years of education: Not on file  . Highest education level: Not on file  Occupational History  . Not on file  Tobacco Use  . Smoking status: Never Smoker  . Smokeless tobacco: Never Used  Substance and Sexual Activity  . Alcohol use: No  . Drug use:  No  . Sexual activity: Yes    Birth control/protection: I.U.D.  Other Topics Concern  . Not on file  Social History Narrative  . Not on file   Social Determinants of Health   Financial Resource Strain: Not on file  Food Insecurity: Not on file  Transportation Needs: Not on file  Physical Activity: Not on file  Stress: Not on file  Social Connections: Not on file  Intimate Partner Violence: Not on file    Outpatient Medications Prior to Visit  Medication Sig Dispense Refill  . cephALEXin (KEFLEX) 500 MG capsule Take 1 capsule (500 mg total) by mouth 4 (four) times daily. Take all of medicine and drink lots of fluids (Patient not taking: Reported on 06/21/2020) 20 capsule 0  . ondansetron (ZOFRAN) 4 MG tablet Take 1 tablet (4 mg total) by mouth every 6 (six) hours. (Patient not taking: No sig reported) 6 tablet 0   No facility-administered medications prior to visit.    No Known Allergies  ROS Review of Systems  Constitutional: Positive for fatigue. Negative for chills and fever.  HENT: Negative.   Eyes: Negative.   Respiratory: Negative for shortness of breath.   Cardiovascular: Positive for palpitations. Negative for chest pain.  Gastrointestinal: Negative.   Endocrine: Negative.   Genitourinary: Negative.   Musculoskeletal: Negative.  Skin: Negative.   Allergic/Immunologic: Negative.   Neurological: Negative for dizziness, weakness and headaches.  Hematological: Negative.   Psychiatric/Behavioral: Negative for dysphoric mood and sleep disturbance. The patient is not nervous/anxious.       Objective:    Physical Exam Vitals and nursing note reviewed.  Constitutional:      Appearance: Normal appearance.  HENT:     Head: Normocephalic and atraumatic.     Right Ear: External ear normal.     Left Ear: External ear normal.     Nose: Nose normal.     Mouth/Throat:     Mouth: Mucous membranes are moist.     Pharynx: Oropharynx is clear.  Eyes:     Extraocular  Movements: Extraocular movements intact.     Conjunctiva/sclera: Conjunctivae normal.     Pupils: Pupils are equal, round, and reactive to light.  Cardiovascular:     Rate and Rhythm: Normal rate and regular rhythm.     Pulses: Normal pulses.     Heart sounds: Normal heart sounds.  Pulmonary:     Effort: Pulmonary effort is normal.     Breath sounds: Normal breath sounds.  Musculoskeletal:        General: Normal range of motion.     Cervical back: Normal range of motion and neck supple.  Skin:    General: Skin is warm and dry.  Neurological:     General: No focal deficit present.     Mental Status: She is alert and oriented to person, place, and time.  Psychiatric:        Mood and Affect: Mood normal.        Behavior: Behavior normal.        Thought Content: Thought content normal.        Judgment: Judgment normal.     BP (!) 137/91 (BP Location: Left Arm, Patient Position: Sitting, Cuff Size: Normal)   Pulse 79   Temp 98.2 F (36.8 C) (Oral)   Resp 18   Ht 5' (1.524 m)   Wt 147 lb (66.7 kg)   SpO2 100%   BMI 28.71 kg/m  Wt Readings from Last 3 Encounters:  01/20/21 147 lb (66.7 kg)  06/21/20 147 lb 6.4 oz (66.9 kg)  11/06/11 160 lb 6.4 oz (72.8 kg)     Health Maintenance Due  Topic Date Due  . Hepatitis C Screening  Never done  . PAP SMEAR-Modifier  Never done    There are no preventive care reminders to display for this patient.  Lab Results  Component Value Date   TSH 1.070 06/21/2020   Lab Results  Component Value Date   WBC 7.4 06/21/2020   HGB 14.9 06/21/2020   HCT 45.2 06/21/2020   MCV 94 06/21/2020   PLT 264 06/21/2020   Lab Results  Component Value Date   NA 138 06/21/2020   K 4.4 06/21/2020   CO2 22 06/21/2020   GLUCOSE 96 06/21/2020   BUN 11 06/21/2020   CREATININE 0.58 06/21/2020   BILITOT 0.4 06/21/2020   ALKPHOS 79 06/21/2020   AST 21 06/21/2020   ALT 42 (H) 06/21/2020   PROT 7.7 06/21/2020   ALBUMIN 4.7 06/21/2020   CALCIUM  9.9 06/21/2020   No results found for: CHOL No results found for: HDL No results found for: LDLCALC No results found for: TRIG No results found for: [facility] Lab Results  Component Value Date   HGBA1C 5.9 (H) 06/21/2020      Assessment &  Plan:   Problem List Items Addressed This Visit      Cardiovascular and Mediastinum   Essential hypertension - Primary   Relevant Medications   hydrochlorothiazide (HYDRODIURIL) 25 MG tablet   Other Relevant Orders   CBC with Differential/Platelet   Comp. Metabolic Panel (12)   TSH   Hypertension in pregnancy, preeclampsia, delivered   Relevant Medications   hydrochlorothiazide (HYDRODIURIL) 25 MG tablet     Endocrine   History of prediabetes     Other   Fatigue   Relevant Orders   TSH    Other Visit Diagnoses    Encounter for pregnancy test, result negative       Encounter for HCV screening test for low risk patient       Relevant Orders   HCV Ab w Reflex to Quant PCR    1. Essential hypertension Trial hydrochlorothiazide.  Patient education given on managing hypertension, patient encouraged to check blood pressure at home on a daily basis, keep a written log and have available for all office visits.  Patient encouraged to return to mobile unit in 3 to 4 weeks for follow-up.  Patient given appointment to follow-up with primary care provider in 3 months.  Red flags given for prompt reevaluation. - CBC with Differential/Platelet - Comp. Metabolic Panel (12) - TSH - hydrochlorothiazide (HYDRODIURIL) 25 MG tablet; Take 1 tablet (25 mg total) by mouth daily.  Dispense: 30 tablet; Refill: 1  2. Fatigue, unspecified type  - TSH  3. Hypertension in pregnancy, preeclampsia, delivered   4. History of prediabetes Currently resolved - A1C 5.1  5. Encounter for pregnancy test, result negative Patient does not desire pregnancy at this time , encouraged to use birth control due to hypertension medication, encouraged to return to mobile  unit for birth control counseling if desired.  6. Encounter for HCV screening test for low risk patient  - HCV Ab w Reflex to Quant PCR    I have reviewed the patient's medical history (PMH, PSH, Social History, Family History, Medications, and allergies) , and have been updated if relevant. I spent 34 minutes reviewing chart and  face to face time with patient.     Meds ordered this encounter  Medications  . hydrochlorothiazide (HYDRODIURIL) 25 MG tablet    Sig: Take 1 tablet (25 mg total) by mouth daily.    Dispense:  30 tablet    Refill:  1    Order Specific Question:   Supervising Provider    Answer:   Storm Frisk [[id number]]    Follow-up: Return in about 4 weeks (around 02/17/2021).    Kasandra Knudsen Mayers, PA-C

## 2021-01-21 ENCOUNTER — Other Ambulatory Visit: Payer: Self-pay

## 2021-01-21 LAB — CBC WITH DIFFERENTIAL/PLATELET
Basophils Absolute: 0 10*3/uL (ref 0.0–0.2)
Basos: 1 %
EOS (ABSOLUTE): 0.1 10*3/uL (ref 0.0–0.4)
Eos: 1 %
Hematocrit: 42.1 % (ref 34.0–46.6)
Hemoglobin: 14.2 g/dL (ref 11.1–15.9)
Immature Grans (Abs): 0 10*3/uL (ref 0.0–0.1)
Immature Granulocytes: 0 %
Lymphocytes Absolute: 2 10*3/uL (ref 0.7–3.1)
Lymphs: 24 %
MCH: 31.6 pg (ref 26.6–33.0)
MCHC: 33.7 g/dL (ref 31.5–35.7)
MCV: 94 fL (ref 79–97)
Monocytes Absolute: 0.6 10*3/uL (ref 0.1–0.9)
Monocytes: 8 %
Neutrophils Absolute: 5.4 10*3/uL (ref 1.4–7.0)
Neutrophils: 66 %
Platelets: 233 10*3/uL (ref 150–450)
RBC: 4.49 x10E6/uL (ref 3.77–5.28)
RDW: 12.4 % (ref 11.7–15.4)
WBC: 8.2 10*3/uL (ref 3.4–10.8)

## 2021-01-21 LAB — HCV INTERPRETATION

## 2021-01-21 LAB — COMP. METABOLIC PANEL (12)
AST: 25 IU/L (ref 0–40)
Albumin/Globulin Ratio: 1.9 (ref 1.2–2.2)
Albumin: 4.8 g/dL (ref 3.8–4.8)
Alkaline Phosphatase: 72 IU/L (ref 44–121)
BUN/Creatinine Ratio: 27 — ABNORMAL HIGH (ref 9–23)
BUN: 16 mg/dL (ref 6–20)
Bilirubin Total: 0.3 mg/dL (ref 0.0–1.2)
Calcium: 9.7 mg/dL (ref 8.7–10.2)
Chloride: 99 mmol/L (ref 96–106)
Creatinine, Ser: 0.6 mg/dL (ref 0.57–1.00)
Globulin, Total: 2.5 g/dL (ref 1.5–4.5)
Glucose: 85 mg/dL (ref 65–99)
Potassium: 3.9 mmol/L (ref 3.5–5.2)
Sodium: 137 mmol/L (ref 134–144)
Total Protein: 7.3 g/dL (ref 6.0–8.5)
eGFR: 118 mL/min/{1.73_m2} (ref >59)

## 2021-01-21 LAB — HCV AB W REFLEX TO QUANT PCR: HCV Ab: <0.1 s/co ratio (ref 0.0–0.9)

## 2021-01-21 LAB — TSH: TSH: 1.48 u[IU]/mL (ref 0.450–4.500)

## 2021-01-26 ENCOUNTER — Telehealth: Payer: Self-pay | Admitting: *Deleted

## 2021-01-26 NOTE — Telephone Encounter (Signed)
Medical Assistant used [employer] to contact patient.  Interpreter Name: Trixie Deis #: [id number] Voicemail states to give a call back to [place] with [facility] at [phone]. Patient is aware of labs being normal and screenings being negative.

## 2021-01-26 NOTE — Telephone Encounter (Signed)
-----   Message from Roney Jaffe, [place] sent at 01/24/2021  1:04 PM EDT ----- Please call patient and let her know that her thyroid function, kidney and liver function are within normal limits.  She does not show signs of anemia.  Her screening for hepatitis C was negative.

## 2021-01-28 ENCOUNTER — Other Ambulatory Visit: Payer: Self-pay

## 2021-04-06 ENCOUNTER — Ambulatory Visit: Payer: Self-pay | Attending: Family Medicine | Admitting: Family Medicine

## 2021-04-06 ENCOUNTER — Other Ambulatory Visit: Payer: Self-pay

## 2021-04-06 ENCOUNTER — Encounter: Payer: Self-pay | Admitting: Family Medicine

## 2021-04-06 VITALS — BP 133/82 | HR 74 | Ht 60.0 in | Wt 138.8 lb

## 2021-04-06 DIAGNOSIS — I10 Essential (primary) hypertension: Secondary | ICD-10-CM

## 2021-04-06 NOTE — Patient Instructions (Signed)
https://www.nhlbi.nih.gov/files/docs/public/heart/dash_brief.pdf">  DASH Eating Plan DASH stands for Dietary Approaches to Stop Hypertension. The DASH eating plan is a healthy eating plan that has been shown to: Reduce high blood pressure (hypertension). Reduce your risk for type 2 diabetes, heart disease, and stroke. Help with weight loss. What are tips for following this plan? Reading food labels Check food labels for the amount of salt (sodium) per serving. Choose foods with less than 5 percent of the Daily Value of sodium. Generally, foods with less than 300 milligrams (mg) of sodium per serving fit into this eating plan. To find whole grains, look for the word "whole" as the first word in the ingredient list. Shopping Buy products labeled as "low-sodium" or "no salt added." Buy fresh foods. Avoid canned foods and pre-made or frozen meals. Cooking Avoid adding salt when cooking. Use salt-free seasonings or herbs instead of table salt or sea salt. Check with your health care provider or pharmacist before using salt substitutes. Do not fry foods. Cook foods using healthy methods such as baking, boiling, grilling, roasting, and broiling instead. Cook with heart-healthy oils, such as olive, canola, avocado, soybean, or sunflower oil. Meal planning  Eat a balanced diet that includes: 4 or more servings of fruits and 4 or more servings of vegetables each day. Try to fill one-half of your plate with fruits and vegetables. 6-8 servings of whole grains each day. Less than 6 oz (170 g) of lean meat, poultry, or fish each day. A 3-oz (85-g) serving of meat is about the same size as a deck of cards. One egg equals 1 oz (28 g). 2-3 servings of low-fat dairy each day. One serving is 1 cup (237 mL). 1 serving of nuts, seeds, or beans 5 times each week. 2-3 servings of heart-healthy fats. Healthy fats called omega-3 fatty acids are found in foods such as walnuts, flaxseeds, fortified milks, and eggs.  These fats are also found in cold-water fish, such as sardines, salmon, and mackerel. Limit how much you eat of: Canned or prepackaged foods. Food that is high in trans fat, such as some fried foods. Food that is high in saturated fat, such as fatty meat. Desserts and other sweets, sugary drinks, and other foods with added sugar. Full-fat dairy products. Do not salt foods before eating. Do not eat more than 4 egg yolks a week. Try to eat at least 2 vegetarian meals a week. Eat more home-cooked food and less restaurant, buffet, and fast food.  Lifestyle When eating at a restaurant, ask that your food be prepared with less salt or no salt, if possible. If you drink alcohol: Limit how much you use to: 0-1 drink a day for women who are not pregnant. 0-2 drinks a day for men. Be aware of how much alcohol is in your drink. In the [place] one drink equals one 12 oz bottle of beer (355 mL), one 5 oz glass of wine (148 mL), or one 1 oz glass of hard liquor (44 mL). General information Avoid eating more than 2,300 mg of salt a day. If you have hypertension, you may need to reduce your sodium intake to 1,500 mg a day. Work with your health care provider to maintain a healthy body weight or to lose weight. Ask what an ideal weight is for you. Get at least 30 minutes of exercise that causes your heart to beat faster (aerobic exercise) most days of the week. Activities may include walking, swimming, or biking. Work with your health care provider   or dietitian to adjust your eating plan to your individual calorie needs. What foods should I eat? Fruits All fresh, dried, or frozen fruit. Canned fruit in natural juice (without addedsugar). Vegetables Fresh or frozen vegetables (raw, steamed, roasted, or grilled). Low-sodium or reduced-sodium tomato and vegetable juice. Low-sodium or reduced-sodium tomatosauce and tomato paste. Low-sodium or reduced-sodium canned vegetables. Grains Whole-grain or  whole-wheat bread. Whole-grain or whole-wheat pasta. Brown rice. Oatmeal. Quinoa. Bulgur. Whole-grain and low-sodium cereals. Pita bread.Low-fat, low-sodium crackers. Whole-wheat flour tortillas. Meats and other proteins Skinless chicken or turkey. Ground chicken or turkey. Pork with fat trimmed off. Fish and seafood. Egg whites. Dried beans, peas, or lentils. Unsalted nuts, nut butters, and seeds. Unsalted canned beans. Lean cuts of beef with fat trimmed off. Low-sodium, lean precooked or cured meat, such as sausages or meatloaves. Dairy Low-fat (1%) or fat-free (skim) milk. Reduced-fat, low-fat, or fat-free cheeses. Nonfat, low-sodium ricotta or cottage cheese. Low-fat or nonfatyogurt. Low-fat, low-sodium cheese. Fats and oils Soft margarine without trans fats. Vegetable oil. Reduced-fat, low-fat, or light mayonnaise and salad dressings (reduced-sodium). Canola, safflower, olive, avocado, soybean, andsunflower oils. Avocado. Seasonings and condiments Herbs. Spices. Seasoning mixes without salt. Other foods Unsalted popcorn and pretzels. Fat-free sweets. The items listed above may not be a complete list of foods and beverages you can eat. Contact a dietitian for more information. What foods should I avoid? Fruits Canned fruit in a light or heavy syrup. Fried fruit. Fruit in cream or buttersauce. Vegetables Creamed or fried vegetables. Vegetables in a cheese sauce. Regular canned vegetables (not low-sodium or reduced-sodium). Regular canned tomato sauce and paste (not low-sodium or reduced-sodium). Regular tomato and vegetable juice(not low-sodium or reduced-sodium). Pickles. Olives. Grains Baked goods made with fat, such as croissants, muffins, or some breads. Drypasta or rice meal packs. Meats and other proteins Fatty cuts of meat. Ribs. Fried meat. Bacon. Bologna, salami, and other precooked or cured meats, such as sausages or meat loaves. Fat from the back of a pig (fatback). Bratwurst.  Salted nuts and seeds. Canned beans with added salt. Canned orsmoked fish. Whole eggs or egg yolks. Chicken or turkey with skin. Dairy Whole or 2% milk, cream, and half-and-half. Whole or full-fat cream cheese. Whole-fat or sweetened yogurt. Full-fat cheese. Nondairy creamers. Whippedtoppings. Processed cheese and cheese spreads. Fats and oils Butter. Stick margarine. Lard. Shortening. Ghee. Bacon fat. Tropical oils, suchas coconut, palm kernel, or palm oil. Seasonings and condiments Onion salt, garlic salt, seasoned salt, table salt, and sea salt. Worcestershire sauce. Tartar sauce. Barbecue sauce. Teriyaki sauce. Soy sauce, including reduced-sodium. Steak sauce. Canned and packaged gravies. Fish sauce. Oyster sauce. Cocktail sauce. Store-bought horseradish. Ketchup. Mustard. Meat flavorings and tenderizers. Bouillon cubes. Hot sauces. Pre-made or packaged marinades. Pre-made or packaged taco seasonings. Relishes. Regular saladdressings. Other foods Salted popcorn and pretzels. The items listed above may not be a complete list of foods and beverages you should avoid. Contact a dietitian for more information. Where to find more information National Heart, Lung, and Blood Institute: www.nhlbi.nih.gov American Heart Association: www.heart.org Academy of Nutrition and Dietetics: www.eatright.org National Kidney Foundation: www.kidney.org Summary The DASH eating plan is a healthy eating plan that has been shown to reduce high blood pressure (hypertension). It may also reduce your risk for type 2 diabetes, heart disease, and stroke. When on the DASH eating plan, aim to eat more fresh fruits and vegetables, whole grains, lean proteins, low-fat dairy, and heart-healthy fats. With the DASH eating plan, you should limit salt (sodium) intake to 2,300   mg a day. If you have hypertension, you may need to reduce your sodium intake to 1,500 mg a day. Work with your health care provider or dietitian to adjust  your eating plan to your individual calorie needs. This information is not intended to replace advice given to you by your health care provider. Make sure you discuss any questions you have with your healthcare provider. Document Revised: 08/08/2019 Document Reviewed: 08/08/2019 Elsevier Patient Education  2022 Elsevier Inc.  

## 2021-04-06 NOTE — Progress Notes (Signed)
Subjective:  Patient ID: Diane Huber, female    DOB: Feb 20, 1981  Age: 40 y.o. MRN: [id number]  CC: Hypertension   HPI Diane Huber is a 40 year old female with hypertension seen to establish care. She was commenced on hydrochlorothiazide at the mobile unit visit 2 months ago.  Interval History: She has not ben taking her HCTZ.because she checked with the Pharmacy and they did not have it. Blood pressures at home have fluctuated between 130-140 systolic. She states she would rather not take an antihypertensive as she has been working on eating healthy.She has also lost 9 lbs in the last 2 months. Denies acute concerns today.  History reviewed. No pertinent past medical history.  Past Surgical History:  Procedure Laterality Date   CESAREAN SECTION  11/05/2011   Procedure: CESAREAN SECTION;  Surgeon: Brock Bad, MD;  Location: [facility];  Service: Gynecology;  Laterality: N/A;  Primary cesarean section of baby boy  at 43 APGAR 4/8 cord ph 7.33    Family History  Problem Relation Age of Onset   Anesthesia problems Neg Hx    Malignant hyperthermia Neg Hx    Hypotension Neg Hx    Pseudochol deficiency Neg Hx     No Known Allergies  Outpatient Medications Prior to Visit  Medication Sig Dispense Refill   hydrochlorothiazide (HYDRODIURIL) 25 MG tablet Take 1 tablet (25 mg total) by mouth daily. (Patient not taking: Reported on 04/06/2021) 30 tablet 1   No facility-administered medications prior to visit.     ROS Review of Systems  Constitutional:  Negative for activity change, appetite change and fatigue.  HENT:  Negative for congestion, sinus pressure and sore throat.   Eyes:  Negative for visual disturbance.  Respiratory:  Negative for cough, chest tightness, shortness of breath and wheezing.   Cardiovascular:  Negative for chest pain and palpitations.  Gastrointestinal:  Negative for abdominal distention, abdominal pain and constipation.  Endocrine:  Negative for polydipsia.  Genitourinary:  Negative for dysuria and frequency.  Musculoskeletal:  Negative for arthralgias and back pain.  Skin:  Negative for rash.  Neurological:  Negative for tremors, light-headedness and numbness.  Hematological:  Does not bruise/bleed easily.  Psychiatric/Behavioral:  Negative for agitation and behavioral problems.    Objective:  BP 133/82   Pulse 74   Ht 5' (1.524 m)   Wt 138 lb 12.8 oz (63 kg)   SpO2 100%   BMI 27.11 kg/m   BP/Weight 04/06/2021 01/20/2021 06/21/2020  Systolic BP 133 137 125  Diastolic BP 82 91 81  Wt. (Lbs) 138.8 147 147.4  BMI 27.11 28.71 29.77      Physical Exam Constitutional:      Appearance: She is well-developed.  Neck:     Vascular: No JVD.  Cardiovascular:     Rate and Rhythm: Normal rate.     Heart sounds: Normal heart sounds. No murmur heard. Pulmonary:     Effort: Pulmonary effort is normal.     Breath sounds: Normal breath sounds. No wheezing or rales.  Chest:     Chest wall: No tenderness.  Abdominal:     General: Bowel sounds are normal. There is no distension.     Palpations: Abdomen is soft. There is no mass.     Tenderness: There is no abdominal tenderness.  Musculoskeletal:        General: Normal range of motion.     Right lower leg: No edema.     Left lower leg: No edema.  Neurological:     Mental Status: She is alert and oriented to person, place, and time.  Psychiatric:        Mood and Affect: Mood normal.    CMP Latest Ref Rng & Units 01/20/2021 06/21/2020 11/05/2011  Glucose 65 - 99 mg/dL 85 96 350(K)  BUN 6 - 20 mg/dL 16 11 9   Creatinine 0.57 - 1.00 mg/dL 9.38 1.82  Sodium 134 - 144 mmol/L 137 138 132(L)  Potassium 3.5 - 5.2 mmol/L 3.9 4.4 3.7  Chloride 96 - 106 mmol/L 99 103 98  CO2 20 - 29 mmol/L - 22 25  Calcium 8.7 - 10.2 mg/dL 9.7 9.9 7.3(L)  Total Protein 6.0 - 8.5 g/dL 7.3 7.7 5.6(L)  Total Bilirubin 0.0 - 1.2 mg/dL 0.3 0.4 0.8  Alkaline Phos 44 - 121 IU/L 72 79 154(H)   AST 0 - 40 IU/L 25 21 304(H)  ALT 0 - 32 IU/L - 42(H) 321(H)    Lipid Panel  No results found for: CHOL, TRIG, HDL, CHOLHDL, VLDL, LDLCALC, LDLDIRECT  CBC    Component Value Date/Time   WBC 8.2 01/20/2021 1742   WBC 13.6 (H) 11/07/2011 0540   RBC 4.49 01/20/2021 1742   RBC 2.54 (L) 11/07/2011 0540   HGB 14.2 01/20/2021 1742   HCT 42.1 01/20/2021 1742   PLT 233 01/20/2021 1742   MCV 94 01/20/2021 1742   MCH 31.6 01/20/2021 1742   MCH 31.1 11/07/2011 0540   MCHC 33.7 01/20/2021 1742   MCHC 33.3 11/07/2011 0540   RDW 12.4 01/20/2021 1742   LYMPHSABS 2.0 01/20/2021 1742   MONOABS 1.2 (H) 11/07/2011 0540   EOSABS 0.1 01/20/2021 1742   BASOSABS 0.0 01/20/2021 1742    Lab Results  Component Value Date   HGBA1C 5.1 01/20/2021    Assessment & Plan:  1. Essential hypertension Stage I Hypertension-controlled She declines pharmacotherapy at this time She would like to work on lifestyle modifications and reassess at next visit Commended on weight loss Counseled on blood pressure goal of less than 130/80, low-sodium, DASH diet, medication compliance, 150 minutes of moderate intensity exercise per week. Discussed medication compliance, adverse effects.    No orders of the defined types were placed in this encounter.   Follow-up: Return in about 3 months (around 07/07/2021) for Hypertension.       07/09/2021, MD, FAAFP. [facility] [place], [place]   04/06/2021, 4:46 PM

## 2021-07-11 ENCOUNTER — Encounter: Payer: Self-pay | Admitting: Family Medicine

## 2021-07-11 ENCOUNTER — Other Ambulatory Visit: Payer: Self-pay

## 2021-07-11 ENCOUNTER — Ambulatory Visit: Payer: Self-pay | Attending: Family Medicine | Admitting: Family Medicine

## 2021-07-11 VITALS — BP 135/85 | HR 84 | Ht 60.0 in | Wt 147.2 lb

## 2021-07-11 DIAGNOSIS — Z3201 Encounter for pregnancy test, result positive: Secondary | ICD-10-CM

## 2021-07-11 DIAGNOSIS — I10 Essential (primary) hypertension: Secondary | ICD-10-CM

## 2021-07-11 LAB — POCT URINE PREGNANCY: Preg Test, Ur: POSITIVE — AB

## 2021-07-11 NOTE — Progress Notes (Signed)
Positive home pregnancy test 

## 2021-07-11 NOTE — Progress Notes (Signed)
Subjective:  Patient ID: Diane Huber, female    DOB: [birth year]  Age: 40 y.o. MRN: [id number]  CC: Hypertension   HPI Davey Bergsma is a 40 y.o. year old female with a history of diet controlled Hypertension.  Interval History: She had a positive home pregnancy test; LMP was 06/07/21.  Pregnancy test in the clinic is positive. She would require a letter to assist in obtaining family-planning Medicaid.  Her blood pressure continues to be diet controlled. She has no additional concerns today.  Past Medical History:  Diagnosis Date   Essential hypertension 01/20/2021    Past Surgical History:  Procedure Laterality Date   CESAREAN SECTION  11/05/2011   Procedure: CESAREAN SECTION;  Surgeon: Brock Bad, MD;  Location: [facility];  Service: Gynecology;  Laterality: N/A;  Primary cesarean section of baby boy  at 22 APGAR 4/8 cord ph 7.33    Family History  Problem Relation Age of Onset   Anesthesia problems Neg Hx    Malignant hyperthermia Neg Hx    Hypotension Neg Hx    Pseudochol deficiency Neg Hx     No Known Allergies  Outpatient Medications Prior to Visit  Medication Sig Dispense Refill   hydrochlorothiazide (HYDRODIURIL) 25 MG tablet Take 1 tablet (25 mg total) by mouth daily. (Patient not taking: No sig reported) 30 tablet 1   No facility-administered medications prior to visit.     ROS Review of Systems  Constitutional:  Negative for activity change, appetite change and fatigue.  HENT:  Negative for congestion, sinus pressure and sore throat.   Eyes:  Negative for visual disturbance.  Respiratory:  Negative for cough, chest tightness, shortness of breath and wheezing.   Cardiovascular:  Negative for chest pain and palpitations.  Gastrointestinal:  Negative for abdominal distention, abdominal pain and constipation.  Endocrine: Negative for polydipsia.  Genitourinary:  Negative for dysuria and frequency.  Musculoskeletal:  Negative for  arthralgias and back pain.  Skin:  Negative for rash.  Neurological:  Negative for tremors, light-headedness and numbness.  Hematological:  Does not bruise/bleed easily.  Psychiatric/Behavioral:  Negative for agitation and behavioral problems.    Objective:  BP 135/85   Pulse 84   Ht 5' (1.524 m)   Wt 147 lb 3.2 oz (66.8 kg)   LMP 06/07/2021   SpO2 100%   BMI 28.75 kg/m   BP/Weight 07/11/2021 04/06/2021 01/20/2021  Systolic BP 135 133 137  Diastolic BP 85 82 91  Wt. (Lbs) 147.2 138.8 147  BMI 28.75 27.11 28.71      Physical Exam Constitutional:      Appearance: She is well-developed.  Cardiovascular:     Rate and Rhythm: Normal rate.     Heart sounds: Normal heart sounds. No murmur heard. Pulmonary:     Effort: Pulmonary effort is normal.     Breath sounds: Normal breath sounds. No wheezing or rales.  Chest:     Chest wall: No tenderness.  Abdominal:     General: Bowel sounds are normal. There is no distension.     Palpations: Abdomen is soft. There is no mass.     Tenderness: There is no abdominal tenderness.  Musculoskeletal:        General: Normal range of motion.     Right lower leg: No edema.     Left lower leg: No edema.  Neurological:     Mental Status: She is alert and oriented to person, place, and time.  Psychiatric:  Mood and Affect: Mood normal.    CMP Latest Ref Rng & Units 01/20/2021 06/21/2020 11/05/2011  Glucose 65 - 99 mg/dL 85 96 630(Z)  BUN 6 - 20 mg/dL 16 11 9   Creatinine 0.57 - 1.00 mg/dL 6.01 0.93  Sodium 134 - 144 mmol/L 137 138 132(L)  Potassium 3.5 - 5.2 mmol/L 3.9 4.4 3.7  Chloride 96 - 106 mmol/L 99 103 98  CO2 20 - 29 mmol/L - 22 25  Calcium 8.7 - 10.2 mg/dL 9.7 9.9 7.3(L)  Total Protein 6.0 - 8.5 g/dL 7.3 7.7 5.6(L)  Total Bilirubin 0.0 - 1.2 mg/dL 0.3 0.4 0.8  Alkaline Phos 44 - 121 IU/L 72 79 154(H)  AST 0 - 40 IU/L 25 21 304(H)  ALT 0 - 32 IU/L - 42(H) 321(H)    Lipid Panel  No results found for: CHOL, TRIG, HDL,  CHOLHDL, VLDL, LDLCALC, LDLDIRECT  CBC    Component Value Date/Time   WBC 8.2 01/20/2021 1742   WBC 13.6 (H) 11/07/2011 0540   RBC 4.49 01/20/2021 1742   RBC 2.54 (L) 11/07/2011 0540   HGB 14.2 01/20/2021 1742   HCT 42.1 01/20/2021 1742   PLT 233 01/20/2021 1742   MCV 94 01/20/2021 1742   MCH 31.6 01/20/2021 1742   MCH 31.1 11/07/2011 0540   MCHC 33.7 01/20/2021 1742   MCHC 33.3 11/07/2011 0540   RDW 12.4 01/20/2021 1742   LYMPHSABS 2.0 01/20/2021 1742   MONOABS 1.2 (H) 11/07/2011 0540   EOSABS 0.1 01/20/2021 1742   BASOSABS 0.0 01/20/2021 1742    Lab Results  Component Value Date   HGBA1C 5.1 01/20/2021    Assessment & Plan:  1. Essential hypertension Diet controlled Continue lifestyle modifications Counseled on blood pressure goal of less than 130/80, low-sodium, DASH diet, medication compliance, 150 minutes of moderate intensity exercise per week. Discussed medication compliance, adverse effects.  2. Positive pregnancy test Provided letter to assist in obtaining family-planning Medicaid after which she will see OB/GYN Will resume her care after pregnancy - POCT urine pregnancy   No orders of the defined types were placed in this encounter.   Follow-up: Return if symptoms worsen or fail to improve.       03/22/2021, MD, FAAFP. [facility] [place], [place]   07/11/2021, 6:08 PM

## 2021-07-30 ENCOUNTER — Encounter (HOSPITAL_COMMUNITY): Payer: Self-pay | Admitting: Obstetrics and Gynecology

## 2021-07-30 ENCOUNTER — Inpatient Hospital Stay (HOSPITAL_COMMUNITY): Payer: Self-pay

## 2021-07-30 ENCOUNTER — Other Ambulatory Visit: Payer: Self-pay

## 2021-07-30 ENCOUNTER — Inpatient Hospital Stay (HOSPITAL_COMMUNITY)
Admission: AD | Admit: 2021-07-30 | Discharge: 2021-07-31 | Disposition: A | Discharge status: Home / Self Care | Payer: Self-pay | Attending: Obstetrics and Gynecology | Admitting: Obstetrics and Gynecology

## 2021-07-30 DIAGNOSIS — O26891 Other specified pregnancy related conditions, first trimester: Secondary | ICD-10-CM | POA: Insufficient documentation

## 2021-07-30 DIAGNOSIS — I10 Essential (primary) hypertension: Secondary | ICD-10-CM

## 2021-07-30 DIAGNOSIS — R109 Unspecified abdominal pain: Secondary | ICD-10-CM | POA: Insufficient documentation

## 2021-07-30 DIAGNOSIS — O10911 Unspecified pre-existing hypertension complicating pregnancy, first trimester: Secondary | ICD-10-CM | POA: Insufficient documentation

## 2021-07-30 DIAGNOSIS — O09521 Supervision of elderly multigravida, first trimester: Secondary | ICD-10-CM | POA: Insufficient documentation

## 2021-07-30 DIAGNOSIS — Z8759 Personal history of other complications of pregnancy, childbirth and the puerperium: Secondary | ICD-10-CM

## 2021-07-30 DIAGNOSIS — Z3A01 Less than 8 weeks gestation of pregnancy: Secondary | ICD-10-CM | POA: Insufficient documentation

## 2021-07-30 LAB — URINALYSIS, ROUTINE W REFLEX MICROSCOPIC
Bacteria, UA: NONE SEEN
Bilirubin Urine: NEGATIVE
Glucose, UA: NEGATIVE mg/dL
Ketones, ur: NEGATIVE mg/dL
Nitrite: NEGATIVE
Protein, ur: NEGATIVE mg/dL
Specific Gravity, Urine: 1.011 (ref 1.005–1.030)
pH: 6 (ref 5.0–8.0)

## 2021-07-30 LAB — CBC
HCT: 37 % (ref 36.0–46.0)
Hemoglobin: 12.9 g/dL (ref 12.0–15.0)
MCH: 32 pg (ref 26.0–34.0)
MCHC: 34.9 g/dL (ref 30.0–36.0)
MCV: 91.8 fL (ref 80.0–100.0)
Platelets: 239 10*3/uL (ref 150–400)
RBC: 4.03 MIL/uL (ref 3.87–5.11)
RDW: 12.7 % (ref 11.5–15.5)
WBC: 9.2 10*3/uL (ref 4.0–10.5)
nRBC: 0 % (ref 0.0–0.2)

## 2021-07-30 IMAGING — US US OB < 14 WEEKS - US OB TV
1 series · 15 of 28 positions shown · non-contrast
Comparison: None.

CLINICAL DATA: Left abdominal pain, vaginal bleeding. LMP
06/07/2021.

EXAM:
OBSTETRIC <14 WK US AND TRANSVAGINAL OB US
TECHNIQUE: Both transabdominal and transvaginal ultrasound examinations were
performed for complete evaluation of the gestation as well as the
maternal uterus, adnexal regions, and pelvic cul-de-sac.
Transvaginal technique was performed to assess early pregnancy.

[Series 1: us ob < 14 weeks - us ob tv · 40 acquisitions, 15 frames shown]
[im 1/40]
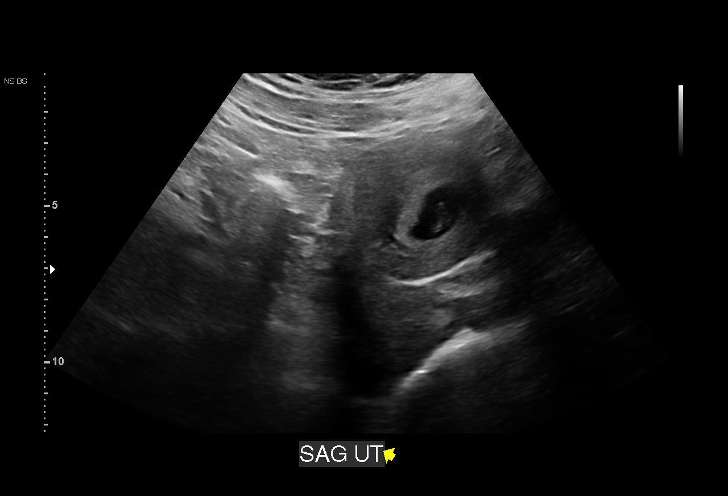
[im 3/40]
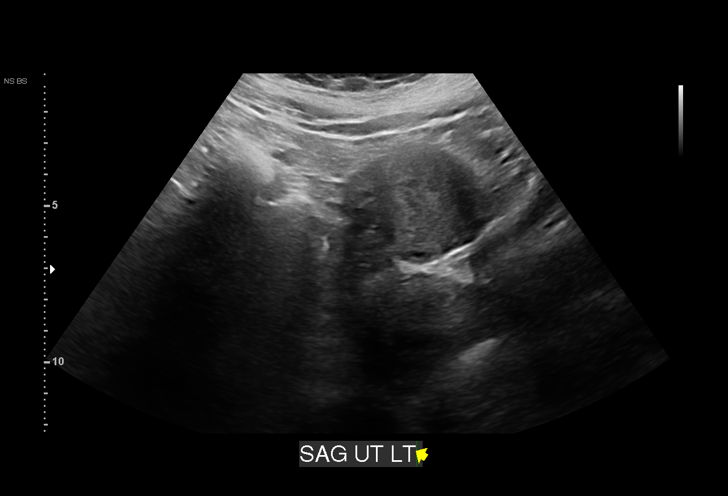
[im 6/40]
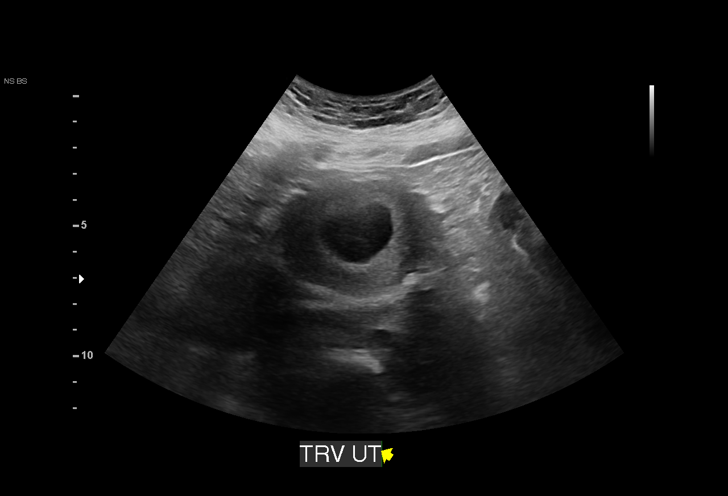
[im 9/40]
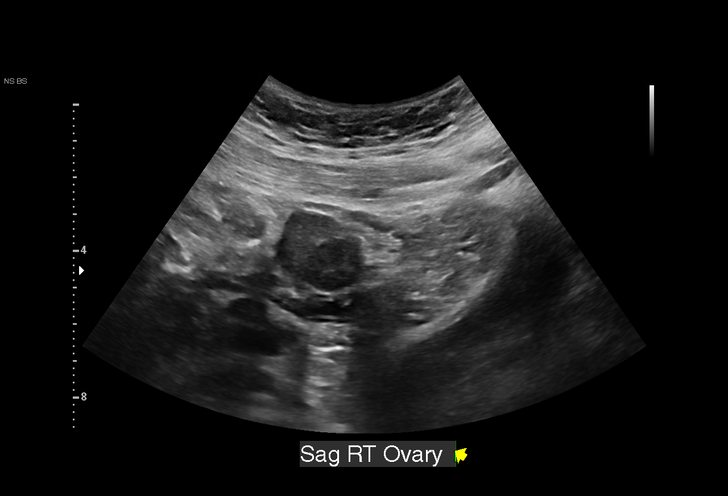
[im 12/40]
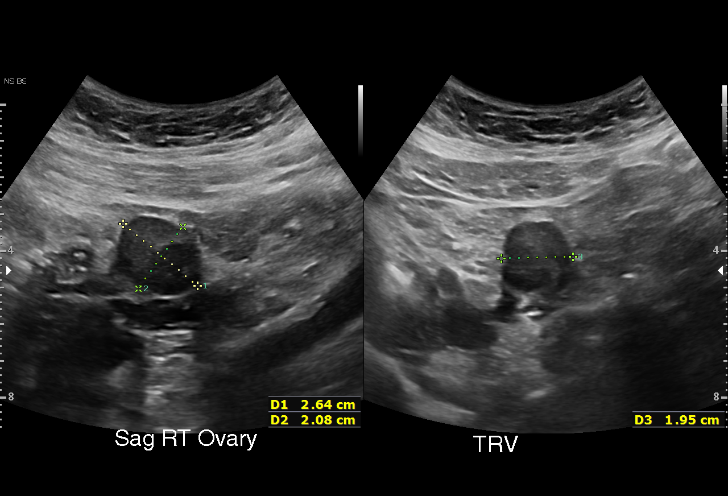
[im 15/40]
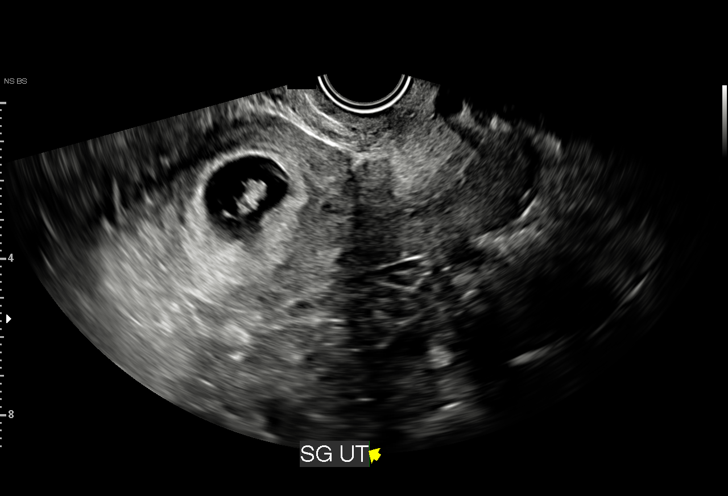
[im 18/40]
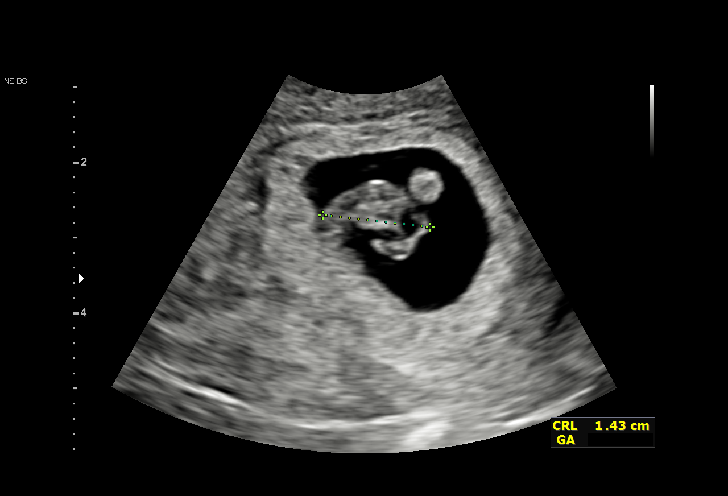
[im 21/40]
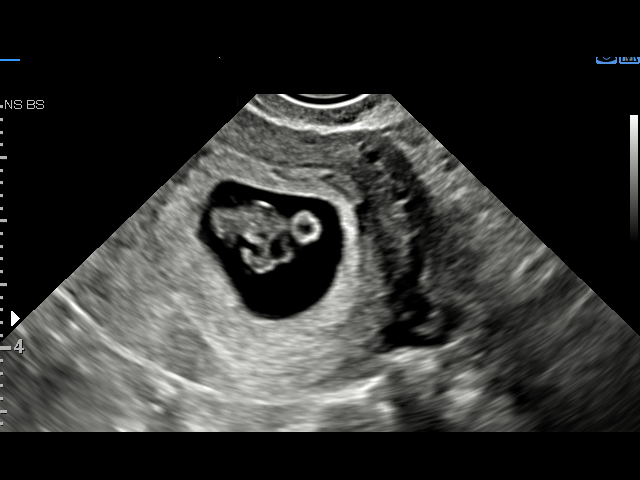
[im 22/40]
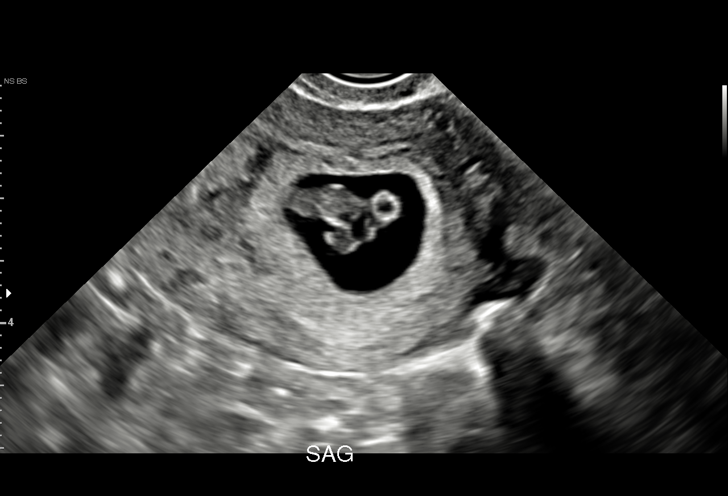
[im 25/40]
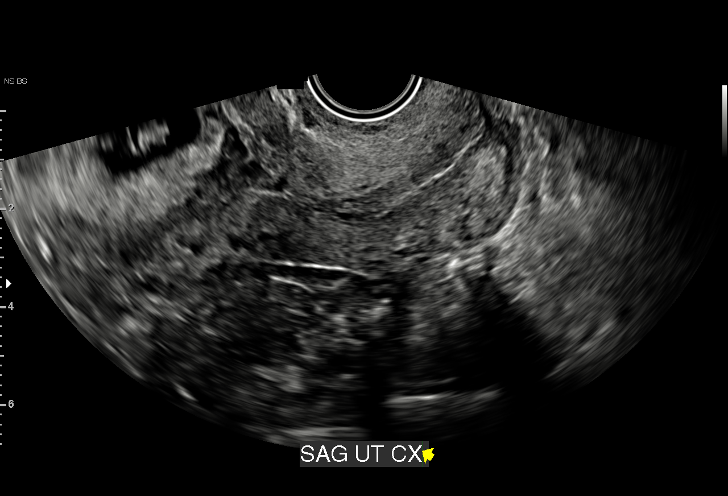
[im 28/40]
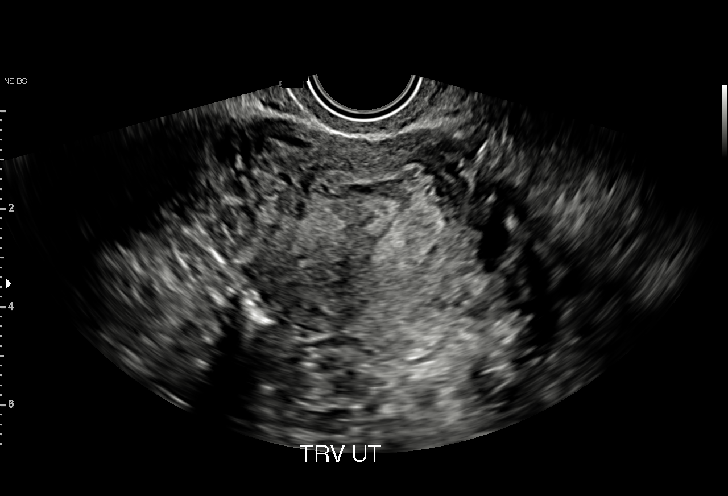
[im 31/40]
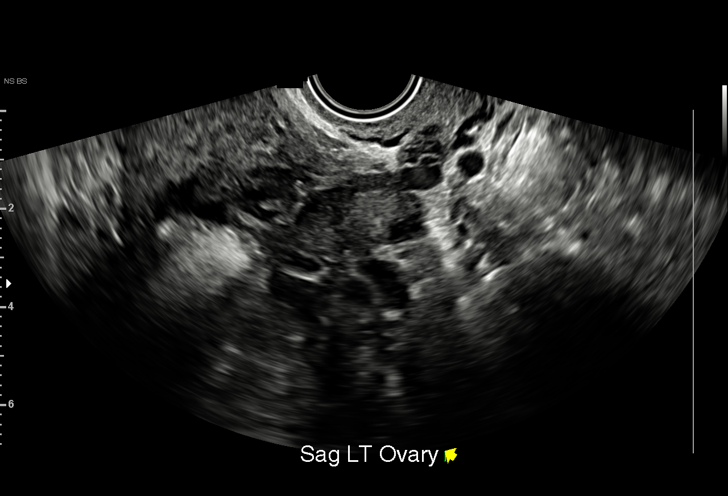
[im 34/40]
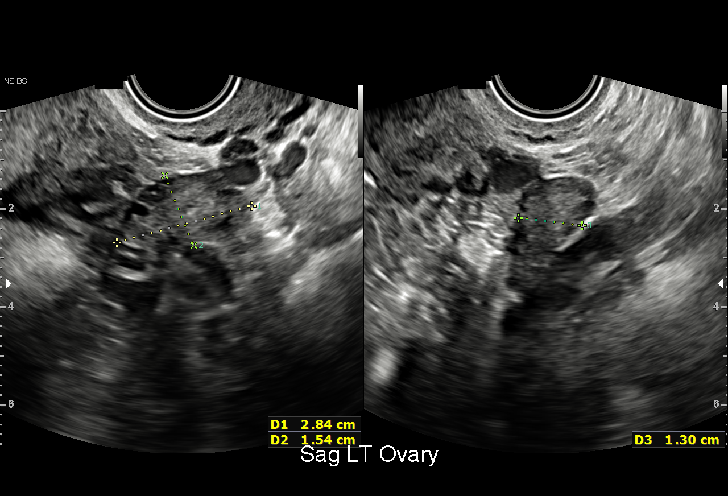
[im 37/40]
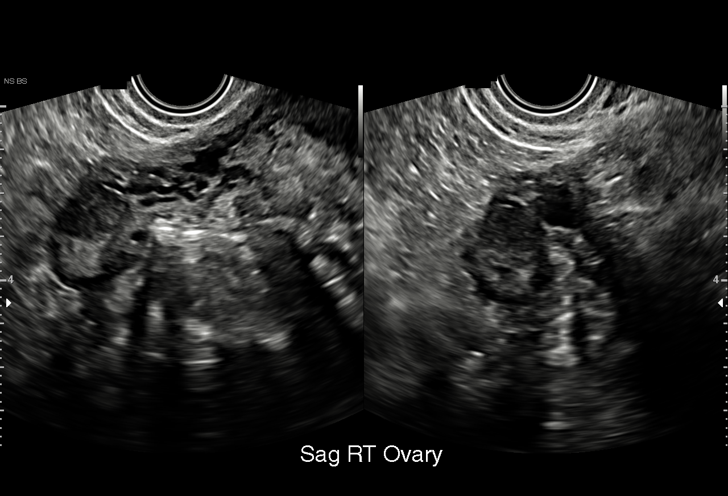
[im 40/40]
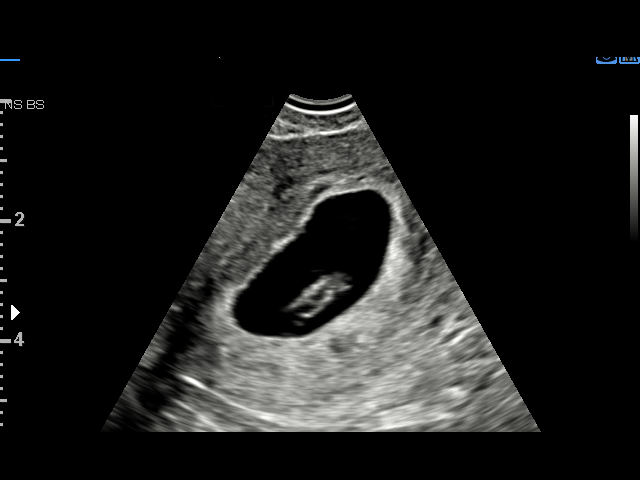

[15 of 28 positions shown; findings below may reference images not displayed]

FINDINGS: Intrauterine gestational sac: Present, single

Yolk sac:  Present, single, normal appearing

Embryo:  Present, single

Cardiac Activity: Visualized.

Heart Rate: 161 bpm

MSD: Appropriate given fetal size

CRL:  15 mm   7 w   5 d                  US EDC: 03/13/2022

Subchorionic hemorrhage:  None visualized.

Maternal uterus/adnexae: The uterus is anteverted. No intrauterine
masses are seen. The cervix is unremarkable. No free fluid within
the cul-de-sac. The maternal ovaries are unremarkable.
IMPRESSION: Single living intrauterine gestation with an estimated gestational
age of 7 weeks, 5 days. No acute abnormality.

Fetal anatomy is not well assessed given the early phase of
gestation. Follow-up second trimester sonography would be more
helpful for definitive evaluation.

## 2021-07-30 NOTE — MAU Note (Signed)
...  Diane Huber is a 40 y.o. at [redacted]w[redacted]d here in MAU reporting: constant lower back pain and intermittent left lower abdominal pain that has been occurring since yesterday but has increased today. She states the pain is exacerbated when she moves. She states she has been experiencing dizziness since the beginning of her pregnancy. Denies any vaginal itching, vaginal odors, or abnormal discharge. Endorses urinary frequency.  Last Sunday, the 6th, she states she had light spotting but it stopped.   Pain score: 8/10 left lower abdomen 8/10 lower back  Lab orders placed from triage: UA

## 2021-07-31 DIAGNOSIS — Z8759 Personal history of other complications of pregnancy, childbirth and the puerperium: Secondary | ICD-10-CM

## 2021-07-31 DIAGNOSIS — I10 Essential (primary) hypertension: Secondary | ICD-10-CM

## 2021-07-31 HISTORY — DX: Personal history of other complications of pregnancy, childbirth and the puerperium: Z87.59

## 2021-07-31 LAB — HCG, QUANTITATIVE, PREGNANCY: hCG, Beta Chain, Quant, S: 74224 m[IU]/mL — ABNORMAL HIGH (ref <5)

## 2021-07-31 MED ORDER — LABETALOL HCL 200 MG PO TABS: 200.0000 mg | ORAL_TABLET | Freq: Two times a day (BID) | ORAL | 2 refills | Status: DC

## 2021-07-31 NOTE — MAU Provider Note (Signed)
Chief Complaint: Abdominal Pain   Event Date/Time   First Provider Initiated Contact with Patient 07/31/21 0006      SUBJECTIVE HPI: Diane Huber is a 40 y.o. [id number] at [redacted]w[redacted]d by LMP who presents to maternity admissions reporting onset of abdominal pain yesterday, worsening today. There are no other associated symptoms. She has not tried any treatments.    Location: LLQ abdomen Quality: cramping Severity: 8/10 on pain scale Duration: 1 day  Timing: intermittent Modifying factors: none Associated signs and symptoms: none  HPI  Past Medical History:  Diagnosis Date   Essential hypertension 01/20/2021   Past Surgical History:  Procedure Laterality Date   CESAREAN SECTION  11/05/2011   Procedure: CESAREAN SECTION;  Surgeon: Brock Bad, MD;  Location: [facility];  Service: Gynecology;  Laterality: N/A;  Primary cesarean section of baby boy  at 76 APGAR 4/8 cord ph 7.33   Social History   Socioeconomic History   Marital status: Single    Spouse name: Not on file   Number of children: Not on file   Years of education: Not on file   Highest education level: Not on file  Occupational History   Not on file  Tobacco Use   Smoking status: Never   Smokeless tobacco: Never  Vaping Use   Vaping Use: Never used  Substance and Sexual Activity   Alcohol use: No   Drug use: No   Sexual activity: Not Currently    Birth control/protection: I.U.D.  Other Topics Concern   Not on file  Social History Narrative   Not on file   Social Determinants of Health   Financial Resource Strain: Not on file  Food Insecurity: Not on file  Transportation Needs: Not on file  Physical Activity: Not on file  Stress: Not on file  Social Connections: Not on file  Intimate Partner Violence: Not on file   No current facility-administered medications on file prior to encounter.   Current Outpatient Medications on File Prior to Encounter  Medication Sig Dispense Refill   Prenatal Vit-Fe  Fumarate-FA (PRENATAL MULTIVITAMIN) TABS tablet Take 1 tablet by mouth daily at 12 noon.     No Known Allergies  ROS:  Review of Systems  Constitutional:  Negative for chills, fatigue and fever.  Respiratory:  Negative for shortness of breath.   Cardiovascular:  Negative for chest pain.  Gastrointestinal:  Positive for abdominal pain.  Genitourinary:  Negative for difficulty urinating, dysuria, flank pain, pelvic pain, vaginal bleeding, vaginal discharge and vaginal pain.  Neurological:  Negative for dizziness and headaches.  Psychiatric/Behavioral: Negative.      I have reviewed patient's Past Medical Hx, Surgical Hx, Family Hx, Social Hx, medications and allergies.   Physical Exam  Patient Vitals for the past 24 hrs:  BP Temp Temp src Pulse SpO2  07/30/21 2235 (!) 154/93 -- -- 82 --  07/30/21 2221 (!) 154/83 98.6 F (37 C) Oral 81 99 %   Constitutional: Well-developed, well-nourished female in no acute distress.  Cardiovascular: normal rate Respiratory: normal effort GI: Abd soft, non-tender. Pos BS x 4 MS: Extremities nontender, no edema, normal ROM Neurologic: Alert and oriented x 4.  GU: Neg CVAT.  PELVIC EXAM: vaginal cultures collected by blind swab    LAB RESULTS Results for orders placed or performed during the hospital encounter of 07/30/21 (from the past 24 hour(s))  Urinalysis, Routine w reflex microscopic Urine, Clean Catch     Status: Abnormal   Collection Time: 07/30/21 10:23 PM  Result Value Ref Range   Color, Urine YELLOW YELLOW   APPearance CLEAR CLEAR   Specific Gravity, Urine 1.011 1.005 - 1.030   pH 6.0 5.0 - 8.0   Glucose, UA NEGATIVE NEGATIVE mg/dL   Hgb urine dipstick SMALL (A) NEGATIVE   Bilirubin Urine NEGATIVE NEGATIVE   Ketones, ur NEGATIVE NEGATIVE mg/dL   Protein, ur NEGATIVE NEGATIVE mg/dL   Nitrite NEGATIVE NEGATIVE   Leukocytes,Ua TRACE (A) NEGATIVE   RBC / HPF 0-5 0 - 5 RBC/hpf   WBC, UA 0-5 0 - 5 WBC/hpf   Bacteria, UA NONE SEEN  NONE SEEN   Squamous Epithelial / LPF 6-10 0 - 5  CBC     Status: None   Collection Time: 07/30/21 11:18 PM  Result Value Ref Range   WBC 9.2 4.0 - 10.5 K/uL   RBC 4.03 3.87 - 5.11 MIL/uL   Hemoglobin 12.9 12.0 - 15.0 g/dL   HCT 40.9 81.1 - 91.4 %   MCV 91.8 80.0 - 100.0 fL   MCH 32.0 26.0 - 34.0 pg   MCHC 34.9 30.0 - 36.0 g/dL   RDW 78.2 95.6 - 21.3 %   Platelets 239 150 - 400 K/uL   nRBC 0.0 0.0 - 0.2 %  hCG, quantitative, pregnancy     Status: Abnormal   Collection Time: 07/30/21 11:18 PM  Result Value Ref Range   hCG, Beta Chain, Quant, S 74,224 (H) <5 mIU/mL       IMAGING US OB LESS THAN 14 WEEKS WITH OB TRANSVAGINAL  Result Date: 07/31/2021 CLINICAL DATA:  Left abdominal pain, vaginal bleeding. LMP 06/07/2021. EXAM: OBSTETRIC <14 WK [place] AND TRANSVAGINAL OB US TECHNIQUE: Both transabdominal and transvaginal ultrasound examinations were performed for complete evaluation of the gestation as well as the maternal uterus, adnexal regions, and pelvic cul-de-sac. Transvaginal technique was performed to assess early pregnancy. COMPARISON:  None. FINDINGS: Intrauterine gestational sac: Present, single Yolk sac:  Present, single, normal appearing Embryo:  Present, single Cardiac Activity: Visualized. Heart Rate: 161 bpm MSD: Appropriate given fetal size CRL:  15 mm   7 w   5 d                  [place] EDC: 03/13/2022 Subchorionic hemorrhage:  None visualized. Maternal uterus/adnexae: The uterus is anteverted. No intrauterine masses are seen. The cervix is unremarkable. No free fluid within the cul-de-sac. The maternal ovaries are unremarkable. IMPRESSION: Single living intrauterine gestation with an estimated gestational age of [redacted] weeks, 5 days. No acute abnormality. Fetal anatomy is not well assessed given the early phase of gestation. Follow-up second trimester sonography would be more helpful for definitive evaluation. Electronically Signed   By: Helyn Numbers M.D.   On: 07/31/2021 01:07    MAU  Management/MDM: Orders Placed This Encounter  Procedures   Wet prep, genital   Culture, OB Urine   US OB LESS THAN 14 WEEKS WITH OB TRANSVAGINAL   Urinalysis, Routine w reflex microscopic Urine, Clean Catch   CBC   hCG, quantitative, pregnancy   Discharge patient    Meds ordered this encounter  Medications   labetalol (NORMODYNE) 200 MG tablet    Sig: Take 1 tablet (200 mg total) by mouth 2 (two) times daily.    Dispense:  60 tablet    Refill:  2    Order Specific Question:   Supervising Provider    Answer:   Jaynie Collins A [[id number]]     [place] with IUP with FHR  BP elevated 150s/90s, dx of essential HTN.  Will start on labetalol 200 mg BID.  Message sent to establish care with [facility] as high risk pt with CHTN.  Rest/ice/heat/increase PO fluids/Tylenol for pain. Return precautions reviewed.   ASSESSMENT 1. Essential hypertension   2. Abdominal pain during pregnancy, first trimester   3. Chronic hypertension   4. [redacted] weeks gestation of pregnancy     PLAN Discharge home Allergies as of 07/31/2021   No Known Allergies      Medication List     TAKE these medications    labetalol 200 MG tablet Commonly known as: NORMODYNE Take 1 tablet (200 mg total) by mouth 2 (two) times daily.   prenatal multivitamin Tabs tablet Take 1 tablet by mouth daily at 12 noon.        Follow-up Information     [facility] Follow up.   Specialty: Obstetrics and Gynecology Why: Terrilee Files le llamar con Susanne Borders y hora, Regresar a MAU segn sea necesario para Electrical engineer information: [phone] [place] [address] [phone]                Sharen Counter Certified Nurse-Midwife 07/31/2021  1:57 AM

## 2021-08-01 ENCOUNTER — Other Ambulatory Visit: Payer: Self-pay

## 2021-08-01 ENCOUNTER — Telehealth: Payer: Self-pay | Admitting: Advanced Practice Midwife

## 2021-08-01 DIAGNOSIS — R8271 Bacteriuria: Secondary | ICD-10-CM

## 2021-08-01 DIAGNOSIS — O2311 Infections of bladder in pregnancy, first trimester: Secondary | ICD-10-CM

## 2021-08-01 LAB — CULTURE, OB URINE: Culture: 30000 — AB

## 2021-08-01 LAB — GC/CHLAMYDIA PROBE AMP (~~LOC~~) NOT AT ARMC
Chlamydia: NEGATIVE
Comment: NEGATIVE
Comment: NORMAL
Neisseria Gonorrhea: NEGATIVE

## 2021-08-01 MED ORDER — CEPHALEXIN 500 MG PO CAPS: 500.0000 mg | ORAL_CAPSULE | Freq: Four times a day (QID) | ORAL | 0 refills | Status: DC

## 2021-08-01 MED ORDER — CEPHALEXIN 500 MG PO CAPS
500.0000 mg | ORAL_CAPSULE | Freq: Four times a day (QID) | ORAL | 0 refills | Status: DC
Start: 2021-08-01 — End: 2021-08-01
  Filled 2021-08-01: qty 28, 7d supply, fill #0

## 2021-08-01 NOTE — Telephone Encounter (Signed)
Pt urine culture positive for GBS UTI at MAU visit on 07/30/21.  Keflex 500 mg QID sent to pt [facility] pharmacy and called pt to inform her of dx and tx using spanish interpreter.  Pt mailbox full so will try to call again later.

## 2021-08-02 ENCOUNTER — Telehealth: Payer: Self-pay | Admitting: Advanced Practice Midwife

## 2021-08-02 NOTE — Telephone Encounter (Signed)
Second attempt to call patient.  Voicemail is full.  Called pt son as contact number and he will reach pt and have her call MAU for details. Pt has Rx for Keflex for GBS UTI at her [facility] pharmacy.

## 2021-08-03 ENCOUNTER — Inpatient Hospital Stay (HOSPITAL_COMMUNITY)
Admission: AD | Admit: 2021-08-03 | Discharge: 2021-08-03 | Disposition: A | Discharge status: Home / Self Care | Payer: Self-pay | Attending: Obstetrics & Gynecology | Admitting: Obstetrics & Gynecology

## 2021-08-03 ENCOUNTER — Encounter (HOSPITAL_COMMUNITY): Payer: Self-pay | Admitting: Obstetrics & Gynecology

## 2021-08-03 ENCOUNTER — Telehealth: Payer: Self-pay | Admitting: Family Medicine

## 2021-08-03 DIAGNOSIS — O10011 Pre-existing essential hypertension complicating pregnancy, first trimester: Secondary | ICD-10-CM | POA: Insufficient documentation

## 2021-08-03 DIAGNOSIS — M545 Low back pain, unspecified: Secondary | ICD-10-CM | POA: Insufficient documentation

## 2021-08-03 DIAGNOSIS — Z3A08 8 weeks gestation of pregnancy: Secondary | ICD-10-CM | POA: Insufficient documentation

## 2021-08-03 DIAGNOSIS — Z79899 Other long term (current) drug therapy: Secondary | ICD-10-CM | POA: Insufficient documentation

## 2021-08-03 DIAGNOSIS — O209 Hemorrhage in early pregnancy, unspecified: Secondary | ICD-10-CM | POA: Insufficient documentation

## 2021-08-03 DIAGNOSIS — Z3490 Encounter for supervision of normal pregnancy, unspecified, unspecified trimester: Secondary | ICD-10-CM

## 2021-08-03 DIAGNOSIS — O26891 Other specified pregnancy related conditions, first trimester: Secondary | ICD-10-CM | POA: Insufficient documentation

## 2021-08-03 DIAGNOSIS — I10 Essential (primary) hypertension: Secondary | ICD-10-CM

## 2021-08-03 DIAGNOSIS — O2341 Unspecified infection of urinary tract in pregnancy, first trimester: Secondary | ICD-10-CM | POA: Insufficient documentation

## 2021-08-03 DIAGNOSIS — O09521 Supervision of elderly multigravida, first trimester: Secondary | ICD-10-CM | POA: Insufficient documentation

## 2021-08-03 LAB — URINALYSIS, ROUTINE W REFLEX MICROSCOPIC
Bacteria, UA: NONE SEEN
Bilirubin Urine: NEGATIVE
Glucose, UA: NEGATIVE mg/dL
Ketones, ur: NEGATIVE mg/dL
Leukocytes,Ua: NEGATIVE
Nitrite: NEGATIVE
Protein, ur: NEGATIVE mg/dL
Specific Gravity, Urine: 1.016 (ref 1.005–1.030)
pH: 7 (ref 5.0–8.0)

## 2021-08-03 LAB — CBC WITH DIFFERENTIAL/PLATELET
Abs Immature Granulocytes: 0.05 10*3/uL (ref 0.00–0.07)
Basophils Absolute: 0 10*3/uL (ref 0.0–0.1)
Basophils Relative: 0 %
Eosinophils Absolute: 0.1 10*3/uL (ref 0.0–0.5)
Eosinophils Relative: 1 %
HCT: 37.5 % (ref 36.0–46.0)
Hemoglobin: 13.3 g/dL (ref 12.0–15.0)
Immature Granulocytes: 1 %
Lymphocytes Relative: 21 %
Lymphs Abs: 1.9 10*3/uL (ref 0.7–4.0)
MCH: 32.2 pg (ref 26.0–34.0)
MCHC: 35.5 g/dL (ref 30.0–36.0)
MCV: 90.8 fL (ref 80.0–100.0)
Monocytes Absolute: 0.8 10*3/uL (ref 0.1–1.0)
Monocytes Relative: 9 %
Neutro Abs: 6.3 10*3/uL (ref 1.7–7.7)
Neutrophils Relative %: 68 %
Platelets: 238 10*3/uL (ref 150–400)
RBC: 4.13 MIL/uL (ref 3.87–5.11)
RDW: 12.6 % (ref 11.5–15.5)
WBC: 9.2 10*3/uL (ref 4.0–10.5)
nRBC: 0 % (ref 0.0–0.2)

## 2021-08-03 MED ORDER — LABETALOL HCL 100 MG PO TABS
200.0000 mg | ORAL_TABLET | Freq: Once | ORAL | Status: AC
  Administered 2021-08-03: 200 mg via ORAL
  Filled 2021-08-03: qty 2

## 2021-08-03 NOTE — Telephone Encounter (Signed)
Patient called saying she is having a bloody spell and talked to a nurse and they told her she needed to come in to be seen. She is not sure what to do.

## 2021-08-03 NOTE — Telephone Encounter (Signed)
Call placed to pt with interpreter Eda. Spoke with pt. Pt states having vaginal bleeding yesterday in toilet and 4 times this morning while wiping. Pt was seen last in MAU on 11/13 and did not have any bleeding at that time. Pt describes bleeding as starting as dark pink to bright red bleeding this morning. States "she can feel it coming out before she goes to the bathroom"  Pt states also has cramping off and on. Pt currently dx with UTI and is taking abx Rx Keflex. Pt has hx of SAB.  Pt advised since cramping and bleeding is new, go to MAU for evaluation. Pt agreeable and verbalized understanding to plan of care.  Laney Pastor

## 2021-08-03 NOTE — MAU Provider Note (Addendum)
History     CSN: [id number]  Arrival date and time: 08/03/21 1030   Event Date/Time   First Provider Initiated Contact with Patient 08/03/21 1113      Chief Complaint  Patient presents with   Vaginal Bleeding   Vaginal Bleeding  Patient is a 40 year old G5 P1-1-2-2 at 8 weeks 1 day who presents to MAU this morning with complaints of lower abdominal cramping and vaginal bleeding since this morning. Patient reports that she first noticed the bleeding when she woke up this morning in room.  She noticed that after she urinated when she wiped.  The blood was on the toilet paper and was a light pink color.  Since then, she has noticed similar pink discharge on toilet paper each time she is use the bathroom.  She has not needed to use or change a menstrual pad today.  She has had intermittent cramping in her lower abdomen/pelvic area since this morning.  She also has dull bilateral lower back pain that comes and goes that has been ongoing since she was last seen in the MAU and diagnosed with a UTI.  It is about the same as previously. She has not taken any medication for this.  She called her doctor to report the bleeding, and they advised that she come to the MAU for further evaluation. Of note, she was last seen in the MAU on 11/13 for abdominal pain, at which point she was diagnosed with essential hypertension, UTI, and an intrauterine pregnancy verified on ultrasound.  She was prescribed labetalol 200 twice daily and Keflex.  She reports that she was unable to pick up the medication until last night.  She took 1 dose of labetalol and 1 dose of Keflex last night.  She has taken neither medication so far today.  She is under the impression that each medication was prescribed for only once a day. She denies fever/chills.  She has had intermittent dizziness with standing since she became pregnant but no other complaints.   Past Medical History:  Diagnosis Date   Essential hypertension 01/20/2021     Past Surgical History:  Procedure Laterality Date   CESAREAN SECTION  11/05/2011   Procedure: CESAREAN SECTION;  Surgeon: Brock Bad, MD;  Location: [facility];  Service: Gynecology;  Laterality: N/A;  Primary cesarean section of baby boy  at 55 APGAR 4/8 cord ph 7.33    Family History  Problem Relation Age of Onset   Anesthesia problems Neg Hx    Malignant hyperthermia Neg Hx    Hypotension Neg Hx    Pseudochol deficiency Neg Hx     Social History   Tobacco Use   Smoking status: Never   Smokeless tobacco: Never  Vaping Use   Vaping Use: Never used  Substance Use Topics   Alcohol use: No   Drug use: No    Allergies: No Known Allergies  Medications Prior to Admission  Medication Sig Dispense Refill Last Dose   cephALEXin (KEFLEX) 500 MG capsule Take 1 capsule (500 mg total) by mouth 4 (four) times daily. 28 capsule 0 08/02/2021   labetalol (NORMODYNE) 200 MG tablet Take 1 tablet (200 mg total) by mouth 2 (two) times daily. 60 tablet 2 08/02/2021   Prenatal Vit-Fe Fumarate-FA (PRENATAL MULTIVITAMIN) TABS tablet Take 1 tablet by mouth daily at 12 noon.   08/03/2021    Review of Systems  Genitourinary:  Positive for vaginal bleeding.  As per HPI, otherwise negative Physical Exam   Blood  pressure (!) 144/92, pulse 90, temperature 98.1 F (36.7 C), resp. rate 18, height 4\' 11"  (1.499 m), weight 65.8 kg, last menstrual period 06/07/2021.  Physical Exam Constitutional:      Appearance: Normal appearance.  HENT:     Nose: Nose normal.  Eyes:     Conjunctiva/sclera: Conjunctivae normal.  Cardiovascular:     Rate and Rhythm: Normal rate and regular rhythm.     Pulses: Normal pulses.     Heart sounds: Normal heart sounds.  Pulmonary:     Effort: Pulmonary effort is normal.     Breath sounds: Normal breath sounds.  Abdominal:     General: Abdomen is flat.     Palpations: Abdomen is soft.     Tenderness: There is no abdominal tenderness. There is no right CVA  tenderness, left CVA tenderness or guarding.  Musculoskeletal:        General: No swelling.     Cervical back: Neck supple.  Skin:    General: Skin is warm and dry.     Capillary Refill: Capillary refill takes less than 2 seconds.  Neurological:     General: No focal deficit present.     Mental Status: She is alert and oriented to person, place, and time.  Psychiatric:        Mood and Affect: Mood normal.        Behavior: Behavior normal.    MAU Course  Procedures: None  MDM Patient's reported amount of bleeding is scant and reassuring.  Bedside ultrasound reveals intrauterine pregnancy with visualized heartbeat (prior transvaginal ultrasound on 11/12 confirmed intrauterine pregnancy).   -Will obtain CBC.   --Pelvic exam discussed with patient.  With shared decision making, decided that speculum exam not necessary with history and bedside ultrasound, and patient prefers to defer. -Patient's blood pressure elevated at 144/92, but patient reports not taking morning dose of labetalol.  Will administer here and recheck. - Repeat UA obtained in triage, would not likely change management as she has known UTI confirmed by culture a few days ago.  Assessment and Plan   #Bleeding in first trimester pregnancy Patient with scant amount of vaginal bleeding.  Reassuring bedside ultrasound demonstrating fetal heart beat.  CBC within normal limits without anemia or leukocytosis.  Discussed findings with patient. - Provided return precautions should bleeding increase - Encouraged pelvic rest  #Essential hypertension Patient with elevated blood pressure on initial evaluation at 144/92.  Morning dose of labetalol administered.  No severe range pressures. -Encourage patient to continue taking labetalol as prescribed, reiterated that it is twice a day.  #UTI Patient recently diagnosed with UTI and started on Keflex. Has only taken 1 dose so far.  No signs today of pyelonephritis-stable vital signs,  afebrile, no leukocytosis, no CVA tenderness. - Encouraged patient to continue taking Keflex as prescribed.  Reiterated that it is dosed 4 times a day. - Repeat UA pending at time of discharge but would not change management   13/12 08/03/2021, 11:36 AM   Attestation of Supervision of Student:  I confirm that I have verified the information documented in the  resident's  note and that I have also personally reperformed the history, physical exam and all medical decision making activities.  I have verified that all services and findings are accurately documented in this student's note; and I agree with management and plan as outlined in the documentation. I have also made any necessary editorial changes.  --Live IUP confirmed during previous MAU encounter on  07/30/2021 --Live IUP with cardiac flicker visible on BSUS today in MAU --Language barrier: interpreter Viria present for all patient interaction  Calvert Cantor, CNM [facility] [employer] 08/03/2021 1:09 PM

## 2021-08-03 NOTE — MAU Note (Signed)
Pt reports when to the BR and saw some spotting on paper when she wiped. And c/o some mild cramping as well.

## 2021-08-08 ENCOUNTER — Other Ambulatory Visit: Payer: Self-pay

## 2021-08-09 ENCOUNTER — Telehealth: Payer: Self-pay

## 2021-08-09 NOTE — Telephone Encounter (Signed)
Pt called nurse line requesting work restrictions note. Call placed back to pt with Interpreter Eda. Spoke with pt. Pt states when seen at MAU on 11/16, pt states provider said she could have restrictions note for work due to her first trimester bleeding. Pt advised will have letter up front today for pick up since pt doesn't have mychart. Pt agreeable to plan of care.  Pt has new OB intake on 11/29. Judeth Cornfield, RN

## 2021-08-16 ENCOUNTER — Telehealth (INDEPENDENT_AMBULATORY_CARE_PROVIDER_SITE_OTHER): Payer: Self-pay

## 2021-08-16 DIAGNOSIS — O10919 Unspecified pre-existing hypertension complicating pregnancy, unspecified trimester: Secondary | ICD-10-CM

## 2021-08-16 DIAGNOSIS — R8271 Bacteriuria: Secondary | ICD-10-CM

## 2021-08-16 DIAGNOSIS — M549 Dorsalgia, unspecified: Secondary | ICD-10-CM

## 2021-08-16 DIAGNOSIS — Z3A00 Weeks of gestation of pregnancy not specified: Secondary | ICD-10-CM

## 2021-08-16 DIAGNOSIS — O26899 Other specified pregnancy related conditions, unspecified trimester: Secondary | ICD-10-CM

## 2021-08-16 DIAGNOSIS — I10 Essential (primary) hypertension: Secondary | ICD-10-CM

## 2021-08-16 DIAGNOSIS — O0990 Supervision of high risk pregnancy, unspecified, unspecified trimester: Secondary | ICD-10-CM | POA: Insufficient documentation

## 2021-08-16 DIAGNOSIS — O09299 Supervision of pregnancy with other poor reproductive or obstetric history, unspecified trimester: Secondary | ICD-10-CM | POA: Insufficient documentation

## 2021-08-16 HISTORY — DX: Bacteriuria: R82.71

## 2021-08-16 NOTE — Progress Notes (Signed)
New OB Intake  I connected with  Diane Huber on 08/16/21 at  2:15 PM EST by telephone and verified that I am speaking with the correct person using two identifiers. Nurse is located at [facility] and pt is located at home. Telephone encounter completed with [facility] interpreter Diane Huber; telephone encounter lasted approx 30 minutes.  I discussed the limitations, risks, security and privacy concerns of performing an evaluation and management service by telephone and the availability of in person appointments. I also discussed with the patient that there may be a patient responsible charge related to this service. The patient expressed understanding and agreed to proceed.  I explained I am completing New OB Intake today. We discussed her EDD of 03/14/22 that is based on LMP of 06/07/21. Pt is G5/P1122. I reviewed her allergies, medications, Medical/Surgical/OB history, and appropriate screenings. Per chart review, c-section was due to failure to progress during IOL for HELLP; notes available in Epic, delivery was at [facility]. I informed her of [facility] services. Based on history, this is pregnancy complicated by hypertension.   Patient Active Problem List   Diagnosis Date Noted   Supervision of high risk pregnancy, antepartum 08/16/2021   GBS bacteriuria 08/16/2021   Chronic hypertension 07/31/2021   Fatigue 01/20/2021   History of prediabetes 01/20/2021   Concerns addressed today  Back pain: Reports back pain began during pregnancy. Reports this occurs mostly at night. Pt is unable to get in comfortable sleeping position. This is limiting ability to sleep. Has not tried any medications. Encouraged pt trial Extra Strength Tylenol and applying heat to the area. Will follow up at new OB appt.  Delivery Plans:  Plans to deliver at [facility].   MyChart/Babyscripts MyChart registration text sent. Babyscripts instructions given and order placed.  Blood Pressure Cuff  Has BP cuff. Explained after first  prenatal appt pt will check weekly and document in Babyscripts. Will follow up at first appt.  Anatomy US Patient may be a good candidate for [facility], in which case she will need to be scheduled at [place].  Labs Declines genetic screening. Routine prenatal labs needed. Will need baseline labs for chronic hypertension. Has past history of elevated A1C of 5.9 on 06/21/20; provider may decide A1C vs early 2 HR GTT. Reports last PAP at [facility]; will contact to confirm.  COVID Vaccine Patient has had COVID vaccine x 2.  Centering in Pregnancy Candidate?  No, Spanish speaking and chronic hypertension on meds.  Mother/ Baby Dyad Candidate?    No, two living children.  Social Determinants of Health Food Insecurity: Patient denies food insecurity. WIC Referral: Patient has appt tomorrow. Transportation: Patient denies transportation needs. Childcare: Discussed no children allowed at ultrasound appointments. Offered childcare services. Does not need at this time.  First visit review I reviewed new OB appt with pt. I explained she will have a provider visit with Diane Loop, MD that includes physical exam with OB lab work; PAP smear to be included if needed. Encounter routed to appropriate provider. Explained that patient will be seen by pregnancy navigator following visit with provider.  Marjo Bicker, RN 08/16/2021  3:01 PM

## 2021-08-29 ENCOUNTER — Other Ambulatory Visit: Payer: Self-pay

## 2021-08-29 ENCOUNTER — Encounter: Payer: Self-pay | Admitting: Obstetrics & Gynecology

## 2021-08-29 ENCOUNTER — Other Ambulatory Visit (HOSPITAL_COMMUNITY)
Admission: RE | Admit: 2021-08-29 | Discharge: 2021-08-29 | Disposition: A | Discharge status: Home / Self Care | Payer: Self-pay | Source: Ambulatory Visit | Attending: Obstetrics & Gynecology | Admitting: Obstetrics & Gynecology

## 2021-08-29 ENCOUNTER — Ambulatory Visit (INDEPENDENT_AMBULATORY_CARE_PROVIDER_SITE_OTHER): Payer: Self-pay | Admitting: Obstetrics & Gynecology

## 2021-08-29 VITALS — BP 135/89 | HR 84 | Wt 146.2 lb

## 2021-08-29 DIAGNOSIS — O09299 Supervision of pregnancy with other poor reproductive or obstetric history, unspecified trimester: Secondary | ICD-10-CM

## 2021-08-29 DIAGNOSIS — O0990 Supervision of high risk pregnancy, unspecified, unspecified trimester: Secondary | ICD-10-CM | POA: Insufficient documentation

## 2021-08-29 DIAGNOSIS — I10 Essential (primary) hypertension: Secondary | ICD-10-CM

## 2021-08-29 DIAGNOSIS — R8271 Bacteriuria: Secondary | ICD-10-CM

## 2021-08-29 MED ORDER — ASPIRIN EC 81 MG PO TBEC: 81.0000 mg | DELAYED_RELEASE_TABLET | Freq: Every day | ORAL | 3 refills | Status: DC

## 2021-08-29 MED ORDER — PANTOPRAZOLE SODIUM 20 MG PO TBEC: 20.0000 mg | DELAYED_RELEASE_TABLET | Freq: Every day | ORAL | 6 refills | Status: DC

## 2021-08-29 NOTE — Progress Notes (Signed)
  Subjective:    Diane Huber is a [id number] [redacted]w[redacted]d being seen today for her first obstetrical visit.  Her obstetrical history is significant for advanced maternal age and HTN . Patient does intend to breast feed. Pregnancy history fully reviewed.  Patient reports heartburn and nausea.  Vitals:   08/29/21 1514 08/29/21 1517  BP: (!) 152/84 135/89  Pulse: 82 84  Weight: 146 lb 3.2 oz (66.3 kg)     HISTORY: OB History  Gravida Para Term Preterm AB Living  5 2 1 1 2 2   SAB IAB Ectopic Multiple Live Births  1 1 0 0 2    # Outcome Date GA Lbr Len/2nd Weight Sex Delivery Anes PTL Lv  5 Current           4 Preterm 11/05/11 [redacted]w[redacted]d  5 lb 6.1 oz (2.44 kg) M CS-LTranv Gen  LIV  3 Term 05/08/03     Vag-Spont   LIV  2 SAB           1 IAB            Past Medical History:  Diagnosis Date   Essential hypertension 01/20/2021   History of gestational hypertension 07/31/2021   Past Surgical History:  Procedure Laterality Date   CESAREAN SECTION  11/05/2011   Procedure: CESAREAN SECTION;  Surgeon: 11/07/2011, MD;  Location: [facility];  Service: Gynecology;  Laterality: N/A;  Primary cesarean section of baby boy  at 72 APGAR 4/8 cord ph 7.33   Family History  Problem Relation Age of Onset   Diabetes Paternal Grandmother    Anesthesia problems Neg Hx    Malignant hyperthermia Neg Hx    Hypotension Neg Hx    Pseudochol deficiency Neg Hx      Exam    Uterus:   12 week  Pelvic Exam:    Perineum: No Hemorrhoids   Vulva: normal   Vagina:  normal mucosa   pH:    Cervix: no lesions   Adnexa: normal adnexa   Bony Pelvis: average  System: Breast:  normal appearance, no masses or tenderness   Skin: normal coloration and turgor, no rashes    Neurologic: normal, normal mood   Extremities: normal strength, tone, and muscle mass   HEENT PERRLA and extra ocular movement intact   Mouth/Teeth    Neck supple   Cardiovascular: regular rate and rhythm, no murmurs or gallops    Respiratory:  appears well, vitals normal, no respiratory distress, acyanotic, normal RR, neck free of mass or lymphadenopathy, chest clear, no wheezing, crepitations, rhonchi, normal symmetric air entry   Abdomen: soft, non-tender; bowel sounds normal; no masses,  no organomegaly   Urinary: urethral meatus normal      Assessment:    Pregnancy: 6/8 Patient Active Problem List   Diagnosis Date Noted   Supervision of high risk pregnancy, antepartum 08/16/2021   GBS bacteriuria 08/16/2021   History of HELLP syndrome, currently pregnant 08/16/2021   Chronic hypertension 07/31/2021   Fatigue 01/20/2021   History of prediabetes 01/20/2021        Plan:     Initial labs drawn. Prenatal vitamins. Problem list reviewed and updated. Genetic Screening discussed : declined.  Ultrasound discussed; fetal survey: ordered.  Follow up in 4 weeks. 50% of 30 min visit spent on counseling and coordination of care.  Labetalol, ASA daily as Rx   03/22/2021 08/29/2021

## 2021-08-30 LAB — COMPREHENSIVE METABOLIC PANEL
ALT: 17 IU/L (ref 0–32)
AST: 15 IU/L (ref 0–40)
Albumin/Globulin Ratio: 1.7 (ref 1.2–2.2)
Albumin: 4.8 g/dL (ref 3.8–4.8)
Alkaline Phosphatase: 68 IU/L (ref 44–121)
BUN/Creatinine Ratio: 14 (ref 9–23)
BUN: 9 mg/dL (ref 6–20)
Bilirubin Total: <0.2 mg/dL (ref 0.0–1.2)
CO2: 22 mmol/L (ref 20–29)
Calcium: 9.9 mg/dL (ref 8.7–10.2)
Chloride: 97 mmol/L (ref 96–106)
Creatinine, Ser: 0.64 mg/dL (ref 0.57–1.00)
Globulin, Total: 2.9 g/dL (ref 1.5–4.5)
Glucose: 90 mg/dL (ref 70–99)
Potassium: 3.9 mmol/L (ref 3.5–5.2)
Sodium: 134 mmol/L (ref 134–144)
Total Protein: 7.7 g/dL (ref 6.0–8.5)
eGFR: 116 mL/min/{1.73_m2} (ref >59)

## 2021-08-30 LAB — CBC/D/PLT+RPR+RH+ABO+RUBIGG...
Antibody Screen: NEGATIVE
Basophils Absolute: 0 10*3/uL (ref 0.0–0.2)
Basos: 1 %
EOS (ABSOLUTE): 0.1 10*3/uL (ref 0.0–0.4)
Eos: 1 %
HCV Ab: <0.1 s/co ratio (ref 0.0–0.9)
HIV Screen 4th Generation wRfx: NONREACTIVE
Hematocrit: 42.1 % (ref 34.0–46.6)
Hemoglobin: 14.2 g/dL (ref 11.1–15.9)
Hepatitis B Surface Ag: NEGATIVE
Immature Grans (Abs): 0 10*3/uL (ref 0.0–0.1)
Immature Granulocytes: 1 %
Lymphocytes Absolute: 2.1 10*3/uL (ref 0.7–3.1)
Lymphs: 24 %
MCH: 31.4 pg (ref 26.6–33.0)
MCHC: 33.7 g/dL (ref 31.5–35.7)
MCV: 93 fL (ref 79–97)
Monocytes Absolute: 0.7 10*3/uL (ref 0.1–0.9)
Monocytes: 8 %
Neutrophils Absolute: 5.7 10*3/uL (ref 1.4–7.0)
Neutrophils: 65 %
Platelets: 249 10*3/uL (ref 150–450)
RBC: 4.52 x10E6/uL (ref 3.77–5.28)
RDW: 13 % (ref 11.7–15.4)
RPR Ser Ql: NONREACTIVE
Rh Factor: POSITIVE
Rubella Antibodies, IGG: 19.7 index (ref >0.99)
WBC: 8.7 10*3/uL (ref 3.4–10.8)

## 2021-08-30 LAB — HEMOGLOBIN A1C
Est. average glucose Bld gHb Est-mCnc: 111 mg/dL
Hgb A1c MFr Bld: 5.5 % (ref 4.8–5.6)

## 2021-08-30 LAB — TSH: TSH: 0.996 u[IU]/mL (ref 0.450–4.500)

## 2021-08-30 LAB — PROTEIN / CREATININE RATIO, URINE
Creatinine, Urine: 50.1 mg/dL
Protein, Ur: 4.3 mg/dL
Protein/Creat Ratio: 86 mg/g creat (ref 0–200)

## 2021-08-30 LAB — HCV INTERPRETATION

## 2021-08-31 LAB — CYTOLOGY - PAP
Chlamydia: NEGATIVE
Comment: NEGATIVE
Comment: NEGATIVE
Comment: NORMAL
Diagnosis: NEGATIVE
High risk HPV: NEGATIVE
Neisseria Gonorrhea: NEGATIVE

## 2021-08-31 LAB — URINE CULTURE, OB REFLEX

## 2021-08-31 LAB — CULTURE, OB URINE

## 2021-09-05 ENCOUNTER — Telehealth: Payer: Self-pay | Admitting: Obstetrics & Gynecology

## 2021-09-05 NOTE — Telephone Encounter (Signed)
Call from babyscripts.  Pt reporting mild headache and BP 146/86.  Upon review of her chart- pt has cHTN and is currently taking Labetalol 200mg  bid.  Advised to take her dose of medication now.  Encouraged pt to keep track of her BPs at home so at her next visit we can review and adjust her medication if needed.  Plan to follow up in the office in one week to review her blood pressure log to consider increased her Labetalol to a higher dose.  []  message sent to [facility]

## 2021-09-13 ENCOUNTER — Ambulatory Visit: Payer: Self-pay

## 2021-09-14 ENCOUNTER — Telehealth: Payer: Self-pay | Admitting: Lactation Services

## 2021-09-14 NOTE — Telephone Encounter (Signed)
Called patient post Baby Scripts alert for elevated BP of 140/86. Patient has an appointment in the office tomorrow for blood pressure check    Called patient with assistance of [employer] telephone Spanish Trixie Deis # [phone]. Patient did not answer. LM for her to call the office at [phone] at her convenience to let us know how she is feeling.

## 2021-09-15 ENCOUNTER — Ambulatory Visit (INDEPENDENT_AMBULATORY_CARE_PROVIDER_SITE_OTHER): Payer: Self-pay

## 2021-09-15 ENCOUNTER — Other Ambulatory Visit: Payer: Self-pay

## 2021-09-15 VITALS — BP 137/85 | HR 77 | Wt 147.5 lb

## 2021-09-15 DIAGNOSIS — Z0130 Encounter for examination of blood pressure without abnormal findings: Secondary | ICD-10-CM

## 2021-09-15 NOTE — Progress Notes (Signed)
Pt here today BP check.  With Spanish Interpreter Eda R., pt reports that she Procardia 30 mg daily.   Pt denies headache or blurry vision. Pt reports that her last dose was at 0700.  Pt BP 137/85  FHR 157.    Pt advised to continue to take medication as prescribed, continue to monitor for sx's of elevated BP, and to contact the office with concerns.  Pt advised that we will f/u with her at appt scheduled for 09/26/21 if not before.  Pt verbalized understanding with no further questions.   Addison Naegeli, RN  09/16/21

## 2021-09-26 ENCOUNTER — Ambulatory Visit (INDEPENDENT_AMBULATORY_CARE_PROVIDER_SITE_OTHER): Payer: Self-pay | Admitting: Obstetrics and Gynecology

## 2021-09-26 ENCOUNTER — Other Ambulatory Visit: Payer: Self-pay

## 2021-09-26 VITALS — BP 133/83 | HR 80 | Wt 147.1 lb

## 2021-09-26 DIAGNOSIS — R8271 Bacteriuria: Secondary | ICD-10-CM

## 2021-09-26 DIAGNOSIS — O09299 Supervision of pregnancy with other poor reproductive or obstetric history, unspecified trimester: Secondary | ICD-10-CM

## 2021-09-26 DIAGNOSIS — Z789 Other specified health status: Secondary | ICD-10-CM

## 2021-09-26 DIAGNOSIS — I10 Essential (primary) hypertension: Secondary | ICD-10-CM

## 2021-09-26 DIAGNOSIS — Z3A15 15 weeks gestation of pregnancy: Secondary | ICD-10-CM

## 2021-09-26 DIAGNOSIS — Z87898 Personal history of other specified conditions: Secondary | ICD-10-CM

## 2021-09-26 DIAGNOSIS — O09522 Supervision of elderly multigravida, second trimester: Secondary | ICD-10-CM

## 2021-09-26 DIAGNOSIS — O0990 Supervision of high risk pregnancy, unspecified, unspecified trimester: Secondary | ICD-10-CM

## 2021-09-26 DIAGNOSIS — Z603 Acculturation difficulty: Secondary | ICD-10-CM

## 2021-09-26 DIAGNOSIS — Z98891 History of uterine scar from previous surgery: Secondary | ICD-10-CM
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
## 2021-09-28 LAB — AFP, SERUM, OPEN SPINA BIFIDA
AFP MoM: 1.05
AFP Value: 35.2 ng/mL
Gest. Age on Collection Date: 15.6 weeks
Maternal Age At EDD: 39 yr
OSBR Risk 1 IN: 10000
Test Results:: NEGATIVE
Weight: 147 [lb_av]

## 2021-10-18 ENCOUNTER — Ambulatory Visit: Payer: Self-pay | Admitting: *Deleted

## 2021-10-18 ENCOUNTER — Ambulatory Visit (HOSPITAL_BASED_OUTPATIENT_CLINIC_OR_DEPARTMENT_OTHER): Payer: Self-pay | Admitting: Maternal & Fetal Medicine

## 2021-10-18 ENCOUNTER — Ambulatory Visit: Payer: Self-pay | Attending: Obstetrics & Gynecology

## 2021-10-18 ENCOUNTER — Other Ambulatory Visit: Payer: Self-pay

## 2021-10-18 ENCOUNTER — Encounter: Payer: Self-pay | Admitting: *Deleted

## 2021-10-18 ENCOUNTER — Other Ambulatory Visit: Payer: Self-pay | Admitting: *Deleted

## 2021-10-18 VITALS — BP 134/77 | HR 86

## 2021-10-18 DIAGNOSIS — O10912 Unspecified pre-existing hypertension complicating pregnancy, second trimester: Secondary | ICD-10-CM

## 2021-10-18 DIAGNOSIS — O43192 Other malformation of placenta, second trimester: Secondary | ICD-10-CM | POA: Insufficient documentation

## 2021-10-18 DIAGNOSIS — O0990 Supervision of high risk pregnancy, unspecified, unspecified trimester: Secondary | ICD-10-CM

## 2021-10-18 DIAGNOSIS — O09292 Supervision of pregnancy with other poor reproductive or obstetric history, second trimester: Secondary | ICD-10-CM

## 2021-10-18 DIAGNOSIS — O09522 Supervision of elderly multigravida, second trimester: Secondary | ICD-10-CM

## 2021-10-18 DIAGNOSIS — O34219 Maternal care for unspecified type scar from previous cesarean delivery: Secondary | ICD-10-CM

## 2021-10-18 DIAGNOSIS — I10 Essential (primary) hypertension: Secondary | ICD-10-CM | POA: Insufficient documentation

## 2021-10-18 IMAGING — US US MFM OB DETAIL+14 WK
1 series · 10 of 28 positions shown · non-contrast
Comparison: none

[Series 1: us mfm ob detail+14 wk · 10 of 132 slices shown]
[im 5/132]
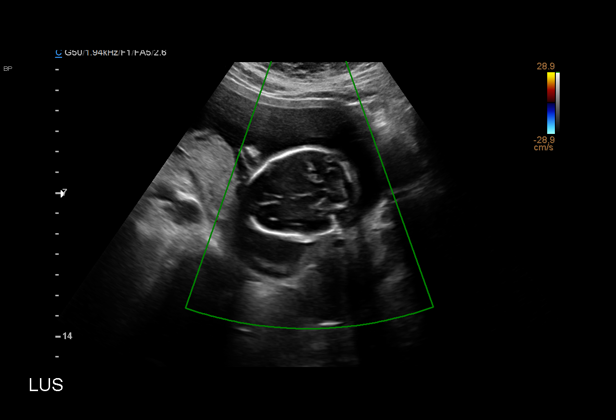
[im 20/132]
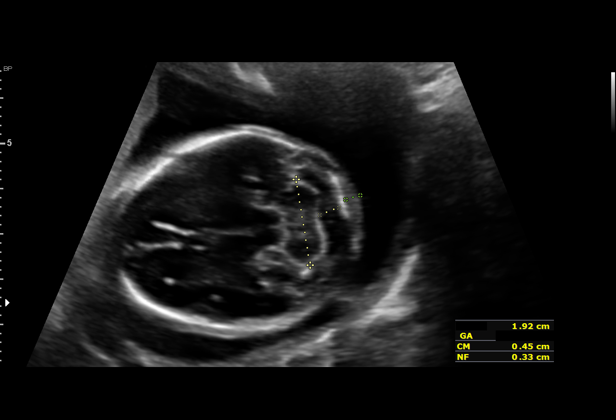
[im 34/132]
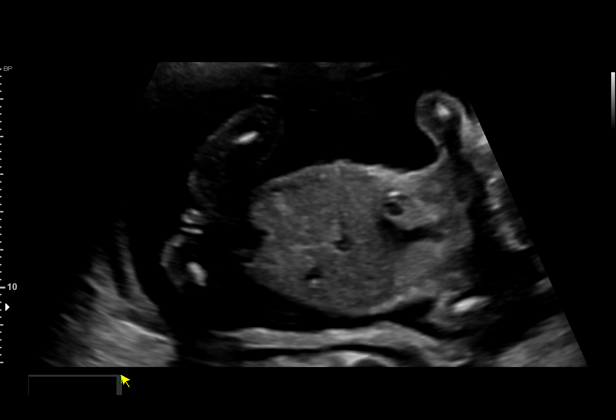
[im 44/132]
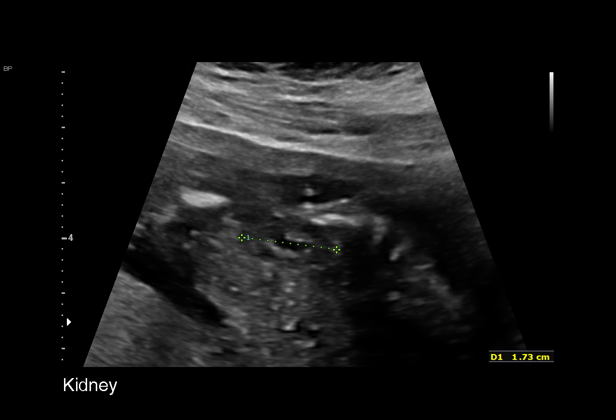
[im 59/132]
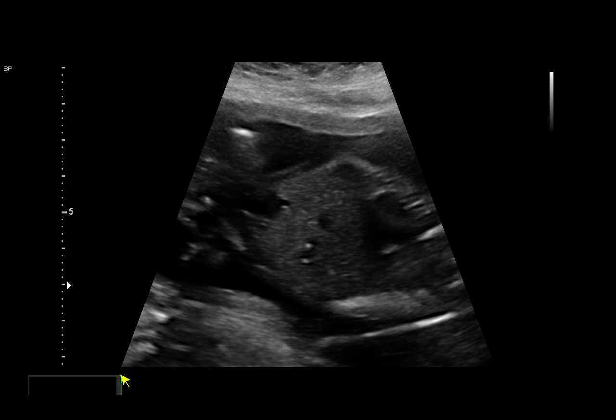
[im 73/132]
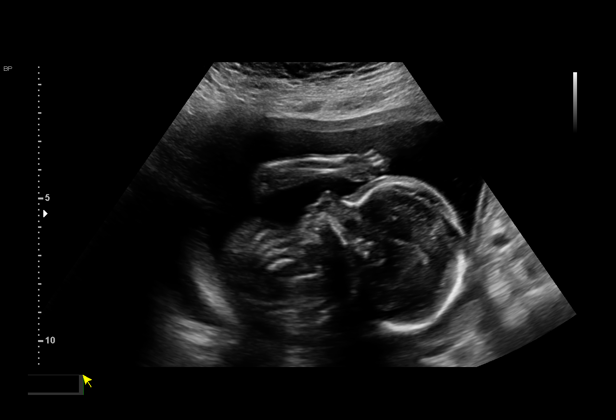
[im 88/132]
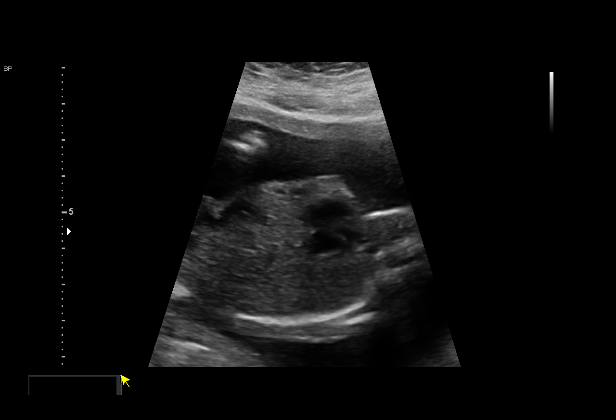
[im 102/132]
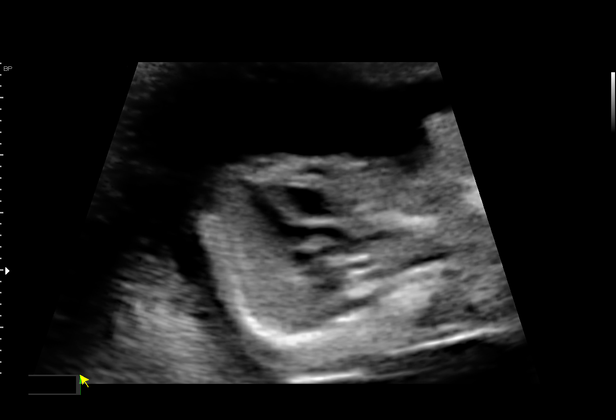
[im 112/132]
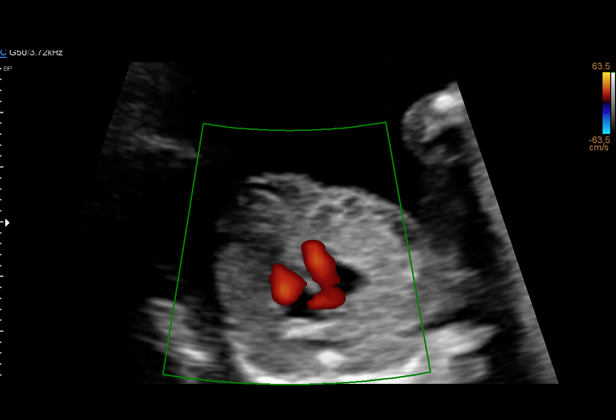
[im 127/132]
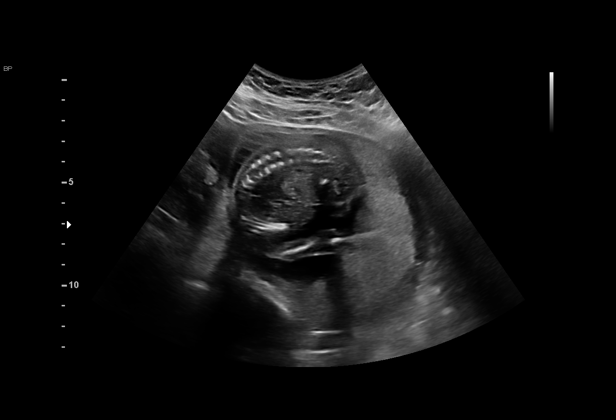

[10 of 28 positions shown; findings below may reference images not displayed]

Addendum:\.br----------------------------------------------------------------------
----------------------------------------------------------------------

----------------------------------------------------------------------

----------------------------------------------------------------------

----------------------------------------------------------------------

----------------------------------------------------------------------
Indications

 Hypertension - Chronic/Pre-existing
 (labetalol)
 Advanced maternal age multigravida 35+,
 second trimester (40 y.o)
 Poor obstetrical history (history of HELLP
 syndrome)
 Poor obstetric history: Previous preterm
 delivery, antepartum (94w4d)
 History of cesarean delivery, currently
 pregnant
 Encounter for antenatal screening for
 malformations
 19 weeks gestation of pregnancy
 Negative AFP
----------------------------------------------------------------------
Fetal Evaluation

 Num Of Fetuses:         1
 Fetal Heart Rate(bpm):  152
 Cardiac Activity:       Observed
 Presentation:           Cephalic
 Placenta:               Posterior
 P. Cord Insertion:      Marginal insertion

 Amniotic Fluid
 AFI FV:      Within normal limits

                             Largest Pocket(cm)

----------------------------------------------------------------------
Biometry

 BPD:      43.1  mm     G. Age:  19w 0d         52  %    CI:        74.66   %    70 - 86
                                                         FL/HC:      17.4   %    16.1 -
 HC:      158.3  mm     G. Age:  18w 5d         28  %    HC/AC:      1.19        1.09 -
 AC:      132.9  mm     G. Age:  18w 5d         38  %    FL/BPD:     64.0   %
 FL:       27.6  mm     G. Age:  18w 3d         24  %    FL/AC:      20.8   %    20 - 24
 HUM:      27.4  mm     G. Age:  18w 5d         43  %
 CER:      19.2  mm     G. Age:  18w 5d         23  %
 NFT:       3.3  mm
 LV:        5.7  mm
 CM:        4.5  mm

 Est. FW:     250  gm      0 lb 9 oz     26  %
----------------------------------------------------------------------
OB History

 Blood Type:   O+
 Gravidity:    5         Term:   1        Prem:   1        SAB:   2
 Living:       2
----------------------------------------------------------------------
Gestational Age

 LMP:           19w 0d        Date:  06/07/21                 EDD:   03/14/22
 U/S Today:     18w 5d                                        EDD:   03/16/22
 Best:          19w 0d     Det. By:  LMP  (06/07/21)          EDD:   03/14/22
----------------------------------------------------------------------
Anatomy

 Cranium:               Appears normal         Aortic Arch:            Not well visualized
 Cavum:                 Appears normal         Ductal Arch:            Appears normal
 Ventricles:            Appears normal         Diaphragm:              Appears normal
 Choroid Plexus:        Appears normal         Stomach:                Appears normal, left
                                                                       sided
 Cerebellum:            Appears normal         Abdomen:                Appears normal
 Posterior Fossa:       Appears normal         Abdominal Wall:         Appears nml (cord
                                                                       insert, abd wall)
 Nuchal Fold:           Appears normal         Cord Vessels:           Appears normal (3
                                                                       vessel cord)
 Face:                  Appears normal         Kidneys:                Appear normal
                        (orbits and profile)
 Lips:                  Appears normal         Bladder:                Appears normal
 Thoracic:              Appears normal         Spine:                  Ltd views no
                                                                       intracranial signs of
                                                                       NTD
 Heart:                 Appears normal         Upper Extremities:      Appears normal
                        (4CH, axis, and
                        situs)
 RVOT:                  Appears normal         Lower Extremities:      Appears normal
 LVOT:                  Appears normal

 Other:  Fetus appears to be female.Nasal bone, lenses, maxilla, mandible
         and falx visualized VC, 3VV and 3VTV visualized. Heels/feet and
         open hands/5th digits visualized. Technically difficult due to fetal
         position.
----------------------------------------------------------------------
Cervix Uterus Adnexa
 Cervix
 Length:           3.39  cm.
 Normal appearance by transabdominal scan.

 Uterus
 Normal shape and size.

 Right Ovary
 Within normal limits.

 Left Ovary
 Within normal limits.

 Cul De Sac
 No free fluid seen.

 Adnexa
 No adnexal mass visualized.
----------------------------------------------------------------------
Impression

 MFM Brief Note

 Ms. Ferienhaus is a 40 yo G5P2 who is here at 19 w 0d for a
 detailed exam given her history of chronic hypertension on
 Labetalol.

 She is seen at the request of Dr. Florentinna Manovi.

 She has a low risk AFP but the had not had the NIPS
 performed.

 Single intrauterine pregnancy was observed.
 Normal anatomy with measurements consistent with dates
 There is good fetal movement and amniotic fluid volume
 Suboptimal views of the fetal anatomy were obtained
 secondary to fetal position.

 We discussed the increased risk for preeclampsia, placental
 abruption and fetal growth restriction in women with chronic
 hypertension. She is taking low dose ASA for preeclampsia
 prevention.
 I explained that serial growth exams should be performed
 every 4 weeks throughout the pregnancy. She continues
 taking Labetalol 200 mg BID.

 Secondly, we discussed that her labetalol should be
 increaesed to maintain an average BP of 135/85 mmHg

 She is aware of the s/sx or preeclampsia including headache,
 vision changes and right upper quadrant pain.

 Baseline labs of UPC, CBC, and CMP were performed and
 normal.

 Follow up growth was scheduled in 4 weeks to complete the
 fetal anatomy. Repeat growth at [DATE] weeks. Initiate
 weekly testing at 32 weeks.

 Lastly a marginal cord insertion was observed today. A
 marginal cord insertion can be associated with small for
 gestational age pregnancies. We will monitor fetal growth in
 conjunction with the recommendations for chronic
 hypertensoin

 All questions answered.

 I spent 30 minutes with > 50% in face to face consultation.
 This visit was held via interpreter.
----------------------------------------------------------------------
----------------------------------------------------------------------

*** End of Addendum ***\.br----------------------------------------------------------------------
----------------------------------------------------------------------

----------------------------------------------------------------------

----------------------------------------------------------------------

----------------------------------------------------------------------

----------------------------------------------------------------------
Indications

 Hypertension - Chronic/Pre-existing
 (labetalol)
 Advanced maternal age multigravida 35+,
 second trimester (40 y.o)
 Poor obstetrical history (history of HELLP
 syndrome)
 Poor obstetric history: Previous preterm
 delivery, antepartum (94w4d)
 History of cesarean delivery, currently
 pregnant
 Encounter for antenatal screening for
 malformations
 19 weeks gestation of pregnancy
 Negative AFP
----------------------------------------------------------------------
Fetal Evaluation

 Num Of Fetuses:         1
 Fetal Heart Rate(bpm):  152
 Cardiac Activity:       Observed
 Presentation:           Cephalic
 Placenta:               Posterior
 P. Cord Insertion:      Marginal insertion

 Amniotic Fluid
 AFI FV:      Within normal limits

                             Largest Pocket(cm)

----------------------------------------------------------------------
Biometry

 BPD:      43.1  mm     G. Age:  19w 0d         52  %    CI:        74.66   %    70 - 86
                                                         FL/HC:      17.4   %    16.1 -
 HC:      158.3  mm     G. Age:  18w 5d         28  %    HC/AC:      1.19        1.09 -
 AC:      132.9  mm     G. Age:  18w 5d         38  %    FL/BPD:     64.0   %
 FL:       27.6  mm     G. Age:  18w 3d         24  %    FL/AC:      20.8   %    20 - 24
 HUM:      27.4  mm     G. Age:  18w 5d         43  %
 CER:      19.2  mm     G. Age:  18w 5d         23  %
 NFT:       3.3  mm
 LV:        5.7  mm
 CM:        4.5  mm

 Est. FW:     250  gm      0 lb 9 oz     26  %
----------------------------------------------------------------------
OB History

 Blood Type:   O+
 Gravidity:    5         Term:   1        Prem:   1        SAB:   2
 Living:       2
----------------------------------------------------------------------
Gestational Age

 LMP:           19w 0d        Date:  06/07/21                 EDD:   03/14/22
 U/S Today:     18w 5d                                        EDD:   03/16/22
 Best:          19w 0d     Det. By:  LMP  (06/07/21)          EDD:   03/14/22
----------------------------------------------------------------------
Anatomy

 Cranium:               Appears normal         Aortic Arch:            Not well visualized
 Cavum:                 Appears normal         Ductal Arch:            Appears normal
 Ventricles:            Appears normal         Diaphragm:              Appears normal
 Choroid Plexus:        Appears normal         Stomach:                Appears normal, left
                                                                       sided
 Cerebellum:            Appears normal         Abdomen:                Appears normal
 Posterior Fossa:       Appears normal         Abdominal Wall:         Appears nml (cord
                                                                       insert, abd wall)
 Nuchal Fold:           Appears normal         Cord Vessels:           Appears normal (3
                                                                       vessel cord)
 Face:                  Appears normal         Kidneys:                Appear normal
                        (orbits and profile)
 Lips:                  Appears normal         Bladder:                Appears normal
 Thoracic:              Appears normal         Spine:                  Ltd views no
                                                                       intracranial signs of
                                                                       NTD
 Heart:                 Appears normal         Upper Extremities:      Appears normal
                        (4CH, axis, and
                        situs)
 RVOT:                  Appears normal         Lower Extremities:      Appears normal
 LVOT:                  Appears normal

 Other:  Fetus appears to be female.Nasal bone, lenses, maxilla, mandible
         and falx visualized VC, 3VV and 3VTV visualized. Heels/feet and
         open hands/5th digits visualized. Technically difficult due to fetal
         position.
----------------------------------------------------------------------
Cervix Uterus Adnexa
 Cervix
 Length:           3.39  cm.
 Normal appearance by transabdominal scan.

 Uterus
 Normal shape and size.

 Right Ovary
 Within normal limits.

 Left Ovary
 Within normal limits.

 Cul De Sac
 No free fluid seen.

 Adnexa
 No adnexal mass visualized.
----------------------------------------------------------------------
Impression

 MFM Brief Note

 Ms. Ferienhaus is a 40 yo G5P2 who is here at 19 w 0d for a
 detailed exam given her history of chronic hypertension on
 Labetalol.

 She is seen at the request of Dr. Florentinna Manovi.

 She has a low risk AFP but the had not had the NIPS
 performed.

 Single intrauterine pregnancy was observed.
 Normal anatomy with measurements consistent with dates
 There is good fetal movement and amniotic fluid volume
 Suboptimal views of the fetal anatomy were obtained
 secondary to fetal position.

 We discussed the increased risk for preeclampsia, placental
 abruption and fetal growth restriction in women with chronic
 hypertension. She is taking low dose ASA for preeclampsia
 prevention.
 I explained that serial growth exams should be performed
 every 4 weeks throughout the pregnancy. She continues
 taking Labetalol 200 mg BID.

 Secondly, we discussed that her labetalol should be
 increaesed to maintain an average BP of 135/85 mmHg

 She is aware of the s/sx or preeclampsia including headache,
 vision changes and right upper quadrant pain.

 Baseline labs of UPC, CBC, and CMP were performed and
 normal.

 Follow up growth was scheduled in 4 weeks to complete the
 fetal anatomy. Repeat growth Rizzy [DATE] weeks. Initiate
 weekly testing at 32 weeks.

 All questions answered.

 I spent 30 minutes with > 50% in face to face consultation.
 This visit was held via interpreter.
----------------------------------------------------------------------
----------------------------------------------------------------------

## 2021-10-18 NOTE — Progress Notes (Addendum)
MFM Brief Note   [name] is a 41 yo G5P2 who is here at 39 w 0d for a detailed exam given her history of chronic hypertension on Labetalol.  She is seen at the request of Dr. Scheryl Darter.   She has a low risk AFP but the had not had the NIPS performed.   Single intrauterine pregnancy was observed.  Normal anatomy with measurements consistent with dates There is good fetal movement and amniotic fluid volume Suboptimal views of the fetal anatomy were obtained secondary to fetal position.  We discussed the increased risk for preeclampsia, placental abruption and fetal growth restriction in women with chronic hypertension. She is taking low dose ASA for preeclampsia prevention. I explained that serial growth exams should be performed every 4 weeks throughout the pregnancy. She continues taking Labetalol 200 mg BID.   Secondly, we discussed that her labetalol should be increaesed to maintain an average BP of 135/85 mmHg  She is aware of the s/sx or preeclampsia including headache, vision changes and right upper quadrant pain.  Baseline labs of UPC, CBC, and CMP were performed and normal.   Follow up growth was scheduled in 4 weeks to complete the fetal anatomy. Repeat growth atr 28/32/36 weeks. Initiate weekly testing at 32 weeks.  Lastly a marginal cord insertion was observed today. A marginal cord insertion can be associated with small for gestational age pregnancies. We will monitor fetal growth in conjunction with the recommendations for chronic hypertensoin   All questions answered.  I spent 30 minutes with > 50% in face to face consultation. This visit was held via interpreter.   Novella Olive, MD.

## 2021-10-18 NOTE — Progress Notes (Unsigned)
Us/

## 2021-10-20 ENCOUNTER — Other Ambulatory Visit: Payer: Self-pay

## 2021-10-20 ENCOUNTER — Ambulatory Visit (INDEPENDENT_AMBULATORY_CARE_PROVIDER_SITE_OTHER): Payer: Self-pay | Admitting: Obstetrics and Gynecology

## 2021-10-20 ENCOUNTER — Encounter: Payer: Self-pay | Admitting: Obstetrics and Gynecology

## 2021-10-20 VITALS — BP 135/88 | HR 89 | Wt 149.9 lb

## 2021-10-20 DIAGNOSIS — O43199 Other malformation of placenta, unspecified trimester: Secondary | ICD-10-CM | POA: Insufficient documentation

## 2021-10-20 DIAGNOSIS — O0990 Supervision of high risk pregnancy, unspecified, unspecified trimester: Secondary | ICD-10-CM

## 2021-10-20 DIAGNOSIS — Z98891 History of uterine scar from previous surgery: Secondary | ICD-10-CM

## 2021-10-20 DIAGNOSIS — I10 Essential (primary) hypertension: Secondary | ICD-10-CM

## 2021-10-20 DIAGNOSIS — R8271 Bacteriuria: Secondary | ICD-10-CM

## 2021-10-20 DIAGNOSIS — Z789 Other specified health status: Secondary | ICD-10-CM
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
## 2021-10-20 NOTE — Progress Notes (Signed)
Pt states can't sleep due to lower back pain.

## 2021-11-15 ENCOUNTER — Ambulatory Visit: Payer: Self-pay | Admitting: *Deleted

## 2021-11-15 ENCOUNTER — Ambulatory Visit: Payer: Self-pay | Attending: Maternal & Fetal Medicine

## 2021-11-15 ENCOUNTER — Other Ambulatory Visit: Payer: Self-pay

## 2021-11-15 VITALS — BP 125/70 | HR 78

## 2021-11-15 DIAGNOSIS — O0990 Supervision of high risk pregnancy, unspecified, unspecified trimester: Secondary | ICD-10-CM | POA: Insufficient documentation

## 2021-11-15 DIAGNOSIS — I10 Essential (primary) hypertension: Secondary | ICD-10-CM | POA: Insufficient documentation

## 2021-11-15 DIAGNOSIS — O10912 Unspecified pre-existing hypertension complicating pregnancy, second trimester: Secondary | ICD-10-CM | POA: Insufficient documentation

## 2021-11-15 DIAGNOSIS — O09522 Supervision of elderly multigravida, second trimester: Secondary | ICD-10-CM | POA: Insufficient documentation

## 2021-11-15 DIAGNOSIS — Z3A23 23 weeks gestation of pregnancy: Secondary | ICD-10-CM

## 2021-11-15 DIAGNOSIS — O10012 Pre-existing essential hypertension complicating pregnancy, second trimester: Secondary | ICD-10-CM

## 2021-11-15 DIAGNOSIS — O09292 Supervision of pregnancy with other poor reproductive or obstetric history, second trimester: Secondary | ICD-10-CM | POA: Insufficient documentation

## 2021-11-15 DIAGNOSIS — O34219 Maternal care for unspecified type scar from previous cesarean delivery: Secondary | ICD-10-CM | POA: Insufficient documentation

## 2021-11-15 IMAGING — US US MFM OB FOLLOW-UP
1 series · 13 of 28 positions shown · non-contrast
Comparison: none

[Series 1: us mfm ob follow-up · 13 of 87 slices shown]
[im 4/87]
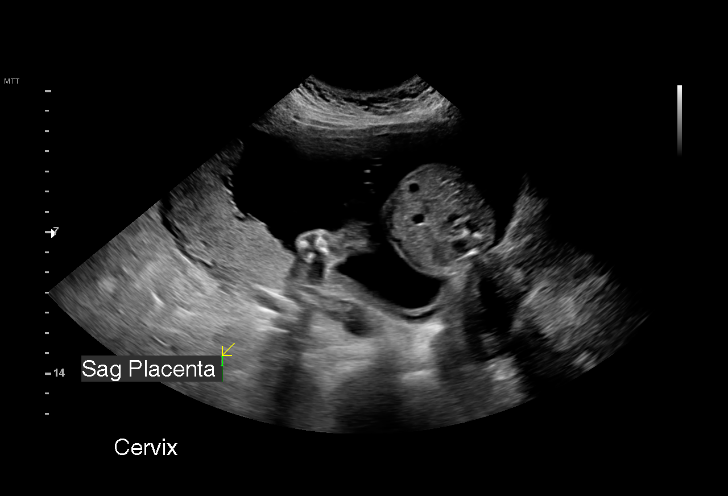
[im 10/87]
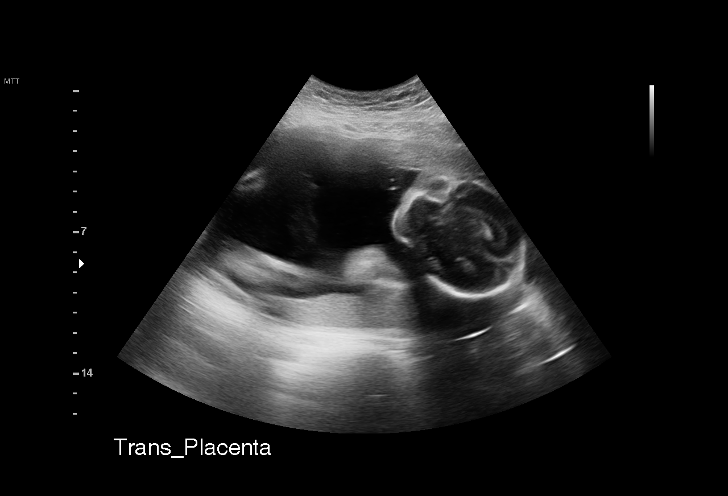
[im 16/87]
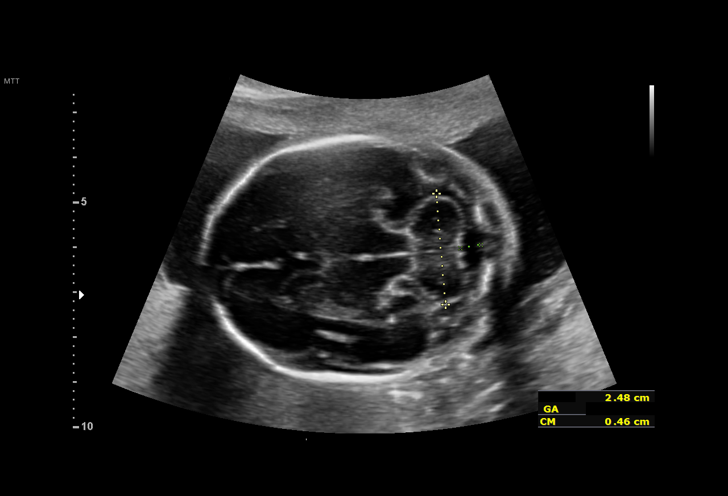
[im 23/87]
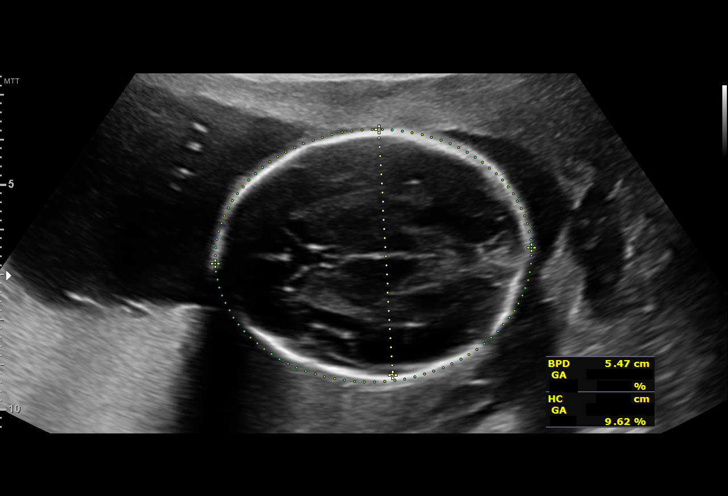
[im 29/87]
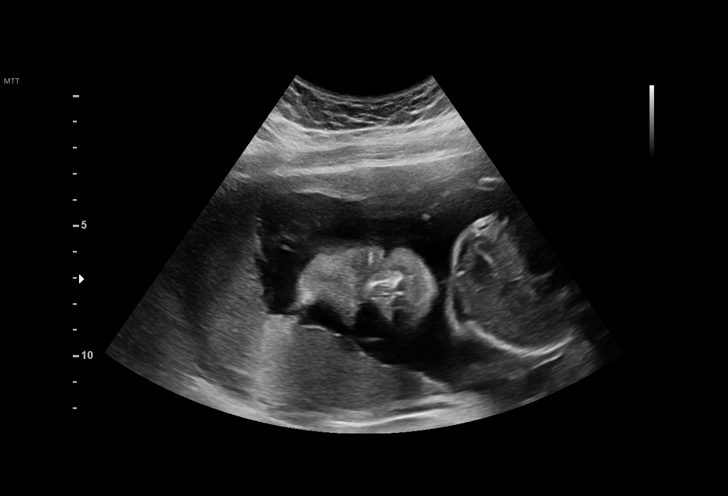
[im 36/87]
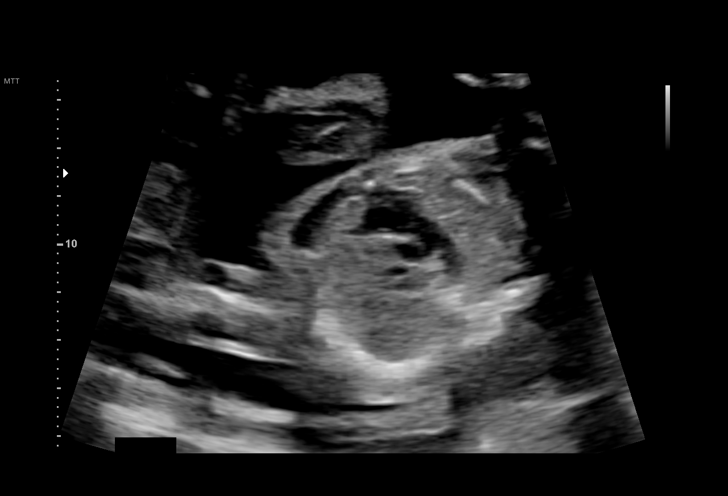
[im 45/87]
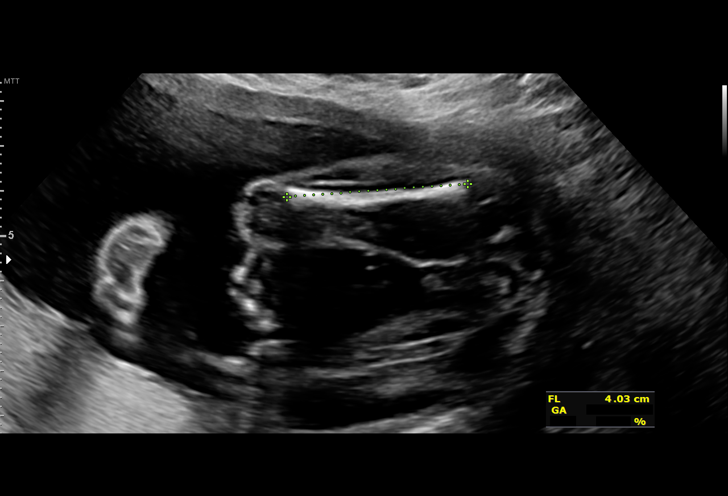
[im 51/87]
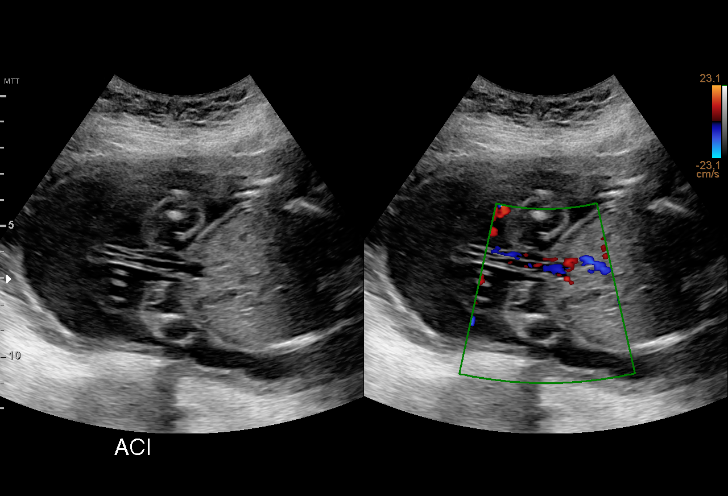
[im 58/87]
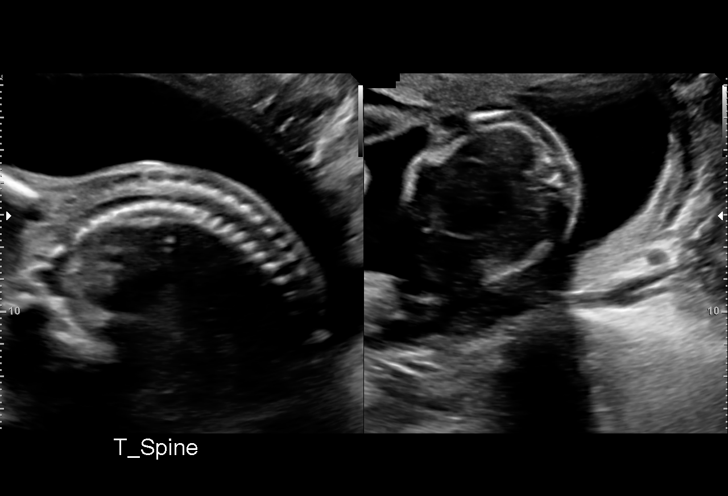
[im 64/87]
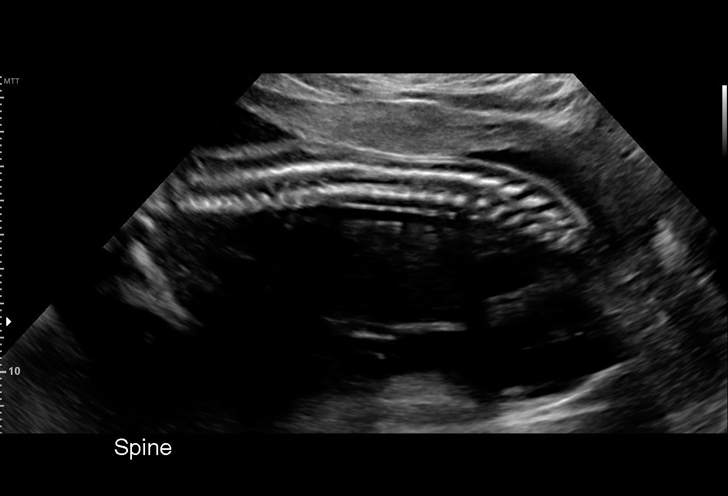
[im 71/87]
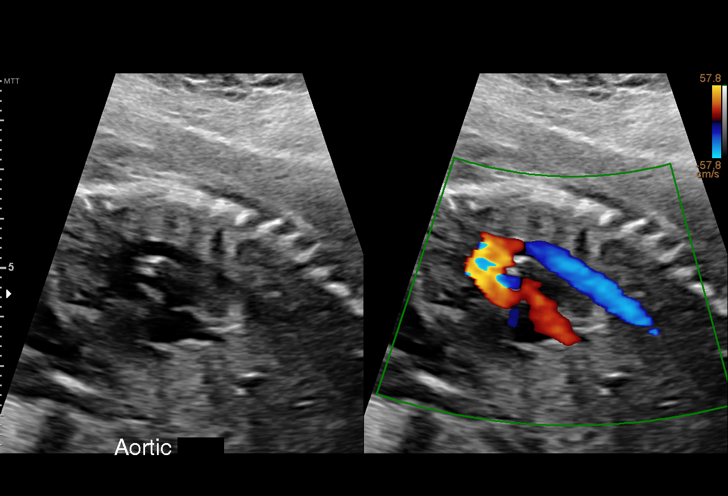
[im 77/87]
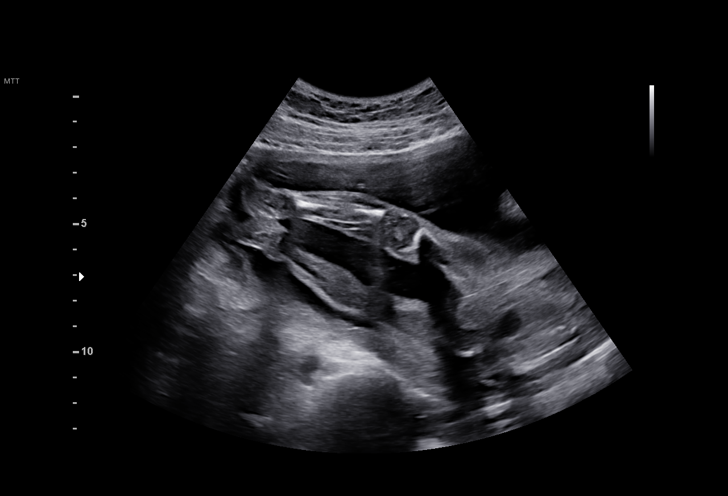
[im 83/87]
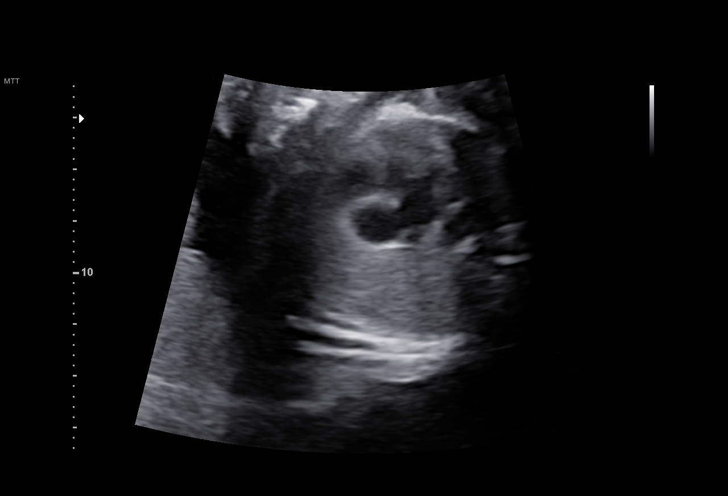

[13 of 28 positions shown; findings below may reference images not displayed]

RENDIC

Indications

 Hypertension - Chronic/Pre-existing
 (labetalol)
 Advanced maternal age multigravida 35+,
 second trimester (40 y.o)
 Poor obstetrical history (history of HELLP
 syndrome)
 23 weeks gestation of pregnancy
 Poor obstetric history: Previous preterm
 delivery, antepartum (49w9d)
 History of cesarean delivery, currently
 pregnant
 Encounter for antenatal screening for
 malformations
 Negative AFP
Fetal Evaluation

 Num Of Fetuses:         1
 Fetal Heart Rate(bpm):  143
 Cardiac Activity:       Observed
 Presentation:           Variable
 Placenta:               Posterior
 P. Cord Insertion:      Marginal insertion previously
 Amniotic Fluid
 AFI FV:      Within normal limits

                             Largest Pocket(cm)

Biometry

 BPD:      54.9  mm     G. Age:  22w 5d         35  %    CI:        75.27   %    70 - 86
                                                         FL/HC:      19.6   %    19.2 -
 HC:      200.7  mm     G. Age:  22w 2d         11  %    HC/AC:      1.02        1.05 -
 AC:      197.5  mm     G. Age:  24w 3d         83  %    FL/BPD:     71.8   %    71 - 87
 FL:       39.4  mm     G. Age:  22w 5d         28  %    FL/AC:      19.9   %    20 - 24
 CER:      24.8  mm     G. Age:  22w 5d         68  %

 LV:        5.7  mm
 CM:        4.6  mm

 Est. FW:     598  gm      1 lb 5 oz     66  %
OB History

 Blood Type:   O+
 Gravidity:    5         Term:   1        Prem:   1        SAB:   2
 Living:       2
Gestational Age

 LMP:           23w 0d        Date:  06/07/21                 EDD:   03/14/22
 U/S Today:     23w 0d                                        EDD:   03/14/22
 Best:          23w 0d     Det. By:  LMP  (06/07/21)          EDD:   03/14/22
Anatomy

 Cranium:               Appears normal         LVOT:                   Appears normal
 Cavum:                 Appears normal         Aortic Arch:            Appears normal
 Ventricles:            Appears normal         Ductal Arch:            Previously seen
 Choroid Plexus:        Appears normal         Diaphragm:              Appears normal
 Cerebellum:            Appears normal         Stomach:                Appears normal, left
                                                                       sided
 Posterior Fossa:       Appears normal         Abdomen:                Previously seen
 Nuchal Fold:           Previously seen        Abdominal Wall:         Appears nml (cord
                                                                       insert, abd wall)
 Face:                  Orbits and profile     Cord Vessels:           Appears normal (3
                        previously seen                                vessel cord)
 Lips:                  Previously seen        Kidneys:                Appear normal
 Palate:                Not well visualized    Bladder:                Appears normal
 Thoracic:              Appears normal         Spine:                  Appears normal
 Heart:                 Appears normal         Upper Extremities:      Previously seen
                        (4CH, axis, and
                        situs)
 RVOT:                  Appears normal         Lower Extremities:      Previously seen

 Other:  Female gender previously seen. Nasal bone, lenses, maxilla,
         mandible, falx, VC, 3VV, 3VTV, Heels/feet and open hands/5th digits
         visualized previously.
Cervix Uterus Adnexa
 Cervix
 Length:           4.01  cm.
 Normal appearance by transabdominal scan.

 Uterus
 No abnormality visualized.

 Right Ovary
 Within normal limits.

 Left Ovary
 Within normal limits.

 Cul De Sac
 No free fluid seen.

 Adnexa
 No abnormality visualized.
Comments

 This patient was seen for a follow up exam as the views of
 the fetal anatomy were unable to be fully visualized during
 her last exam.  Her pregnancy has been complicated by
 advanced maternal age and chronic hypertension that is
 treated with labetalol.  She denies any problems since her
 last exam.
 She has declined all screening tests for fetal aneuploidy in
 her current pregnancy.
 She was informed that the fetal growth and amniotic fluid
 level appears appropriate for her gestational age.
 The views of the fetal anatomy were visualized today.  There
 were no obvious anomalies noted.
 The limitations of ultrasound in the detection of all anomalies
 was discussed.
 Due to chronic hypertension, we will continue to follow her
 with monthly growth ultrasounds.  Weekly fetal testing should
 be started at 32 weeks.

 Another growth scan was scheduled in 4 weeks.

 All conversations were held with the patient today with the
 help of a Spanish interpreter.

## 2021-11-16 ENCOUNTER — Other Ambulatory Visit: Payer: Self-pay | Admitting: *Deleted

## 2021-11-16 DIAGNOSIS — O09899 Supervision of other high risk pregnancies, unspecified trimester: Secondary | ICD-10-CM

## 2021-11-16 DIAGNOSIS — O10912 Unspecified pre-existing hypertension complicating pregnancy, second trimester: Secondary | ICD-10-CM

## 2021-11-16 DIAGNOSIS — O09522 Supervision of elderly multigravida, second trimester: Secondary | ICD-10-CM

## 2021-11-16 DIAGNOSIS — O09299 Supervision of pregnancy with other poor reproductive or obstetric history, unspecified trimester: Secondary | ICD-10-CM

## 2021-11-16 DIAGNOSIS — O34219 Maternal care for unspecified type scar from previous cesarean delivery: Secondary | ICD-10-CM

## 2021-11-17 ENCOUNTER — Ambulatory Visit (INDEPENDENT_AMBULATORY_CARE_PROVIDER_SITE_OTHER): Payer: Self-pay | Admitting: Obstetrics and Gynecology

## 2021-11-17 ENCOUNTER — Other Ambulatory Visit: Payer: Self-pay

## 2021-11-17 ENCOUNTER — Encounter: Payer: Self-pay | Admitting: Obstetrics and Gynecology

## 2021-11-17 VITALS — BP 142/88 | HR 80 | Wt 151.5 lb

## 2021-11-17 DIAGNOSIS — I10 Essential (primary) hypertension: Secondary | ICD-10-CM

## 2021-11-17 DIAGNOSIS — Z789 Other specified health status: Secondary | ICD-10-CM

## 2021-11-17 DIAGNOSIS — O43199 Other malformation of placenta, unspecified trimester: Secondary | ICD-10-CM

## 2021-11-17 DIAGNOSIS — O0990 Supervision of high risk pregnancy, unspecified, unspecified trimester: Secondary | ICD-10-CM

## 2021-11-17 DIAGNOSIS — O09299 Supervision of pregnancy with other poor reproductive or obstetric history, unspecified trimester: Secondary | ICD-10-CM

## 2021-11-17 DIAGNOSIS — Z98891 History of uterine scar from previous surgery: Secondary | ICD-10-CM

## 2021-11-17 DIAGNOSIS — R8271 Bacteriuria: Secondary | ICD-10-CM
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
## 2021-11-17 NOTE — Patient Instructions (Signed)
Segundo trimestre de embarazo °Second Trimester of Pregnancy °El segundo trimestre de embarazo va desde la semana 13 hasta la semana 27. También se dice que va desde el mes 4 hasta el mes 6 de embarazo. Este suele ser el momento en el que mejor se siente. °Durante el segundo trimestre: °Las náuseas del embarazo han disminuido o han desaparecido. °Usted puede tener más energía. °Usted puede tener hambre con más frecuencia. °En esta época, el bebé en gestación (feto) crece muy rápido. Hacia el final del sexto mes, el bebé en gestación puede medir aproximadamente 12 pulgadas y pesar alrededor de 1½ libras. Es probable que comience a sentir que el bebé se mueve entre las 16 y las 20 semanas de embarazo. °Cambios en el cuerpo durante el segundo trimestre °Su organismo continúa atravesando por muchos cambios durante este período. Los cambios varían y generalmente vuelven a la normalidad después del nacimiento del bebé. °Cambios físicos °Aumentará más peso. °Podrán aparecer las primeras estrías en las caderas, el vientre (abdomen) y las mamas. °Las mamas crecerán y pueden doler. °Pueden aparecer zonas oscuras o manchas en el rostro. °Es posible que se forme una línea oscura desde el ombligo hasta la zona del pubis (linea nigra). °Tal vez haya cambios en el cabello. °Cambios en la salud °Es posible que tenga dolores de cabeza. °Es posible que tenga acidez estomacal. °Es posible que tenga dificultades para defecar (estreñimiento). °Es posible que tenga hemorroides o venas abultadas e hinchadas (venas varicosas). °Las encías pueden sangrarle. °Es posible que haga pis (orine) con mayor frecuencia. °Puede sentir dolor en la espalda. °Siga estas instrucciones en su casa: °Medicamentos °Use los medicamentos de venta libre y los recetados solamente como se lo haya indicado el médico. Algunos medicamentos no son seguros durante el embarazo. °Tome vitaminas prenatales que contengan por lo menos 600 microgramos (mcg) de ácido  fólico. °Comida y bebida °Consuma comidas saludables que incluyan lo siguiente: °Frutas y verduras frescas. °Cereales integrales. °Buenas fuentes de proteínas, como carne, huevos y tofu. °Productos lácteos con bajo contenido de grasa. °Evite la carne cruda y el jugo, la leche y el queso sin pasteurizar. °Es posible que deba tomar medidas para prevenir o tratar los problemas para defecar: °Beber suficiente líquido para mantener el pis (orina) de color amarillo pálido. °Come alimentos ricos en fibra. Entre ellos, frijoles, cereales integrales y frutas y verduras frescas. °Limitar los alimentos con alto contenido de grasa y azúcar. Estos incluyen alimentos fritos o dulces. °Actividad °Haga ejercicios solamente como se lo haya indicado el médico. La mayoría de las personas pueden realizar su actividad física habitual durante el embarazo. Intente realizar como mínimo 30 minutos de actividad física por lo menos 5 días a la semana. °Deje de hacer ejercicio si tiene dolor o cólicos en el vientre o en la zona lumbar. °No haga ejercicio si hace demasiado calor, hay demasiada humedad o se encuentra en un lugar de mucha altura (altitud elevada). °Evite levantar pesos excesivos. °Si lo desea, puede continuar teniendo relaciones sexuales, a menos que el médico le indique lo contrario. °Alivio del dolor y del malestar °Use un sostén que le brinde buen soporte si le duelen las mamas. °Dese baños de asiento con agua tibia para aliviar el dolor o las molestias causadas por las hemorroides. Use una crema para las hemorroides si el médico la autoriza. °Descanse con las piernas levantadas (elevadas) si tiene calambres en las piernas o dolor en la parte baja de la espalda. °Si desarrolla venas abultadas en las piernas: °Use   medias de compresión según las indicaciones de su médico. °Levante los pies durante 15 minutos, 3 o 4 veces por día. °Limite la sal en sus alimentos. °Seguridad °Use el cinturón de seguridad en todo momento mientras  vaya en auto. °Hable con el médico si alguien le está haciendo daño o gritando mucho. °Estilo de vida °No se dé baños de inmersión en agua caliente, baños turcos ni saunas. °No se haga duchas vaginales. No use tampones ni toallas higiénicas perfumadas. °Evite el contacto con las bandejas sanitarias de los gatos y la tierra que estos animales usan. Estos contienen gérmenes que pueden dañar al bebé y causar la pérdida del bebé ya sea aborto espontáneo o muerte fetal. °No consuma medicamentos a base de hierbas, drogas ilegales, ni medicamentos que el médico no haya autorizado. No beba alcohol. °No fume ni consuma ningún producto que contenga nicotina o tabaco. Si necesita ayuda para dejar de fumar, consulte al médico. °Instrucciones generales °Cumpla con todas las visitas de seguimiento. Esto es importante. °Consulte a su médico acerca de dónde se dictan clases prenatales cerca de donde vive. °Consulte a su médico sobre los alimentos que debe comer o pídale que la ayude a encontrar a un asesor. °Dónde buscar más información °American Pregnancy Association (Asociación Americana del Embarazo): americanpregnancy.org °American College of Obstetricians and Gynecologists (Colegio Estadounidense de Obstetras y Ginecólogos): www.acog.org °Office on Women's Health (Oficina para la Salud de la Mujer): womenshealth.gov/pregnancy °Comuníquese con un médico si: °Tiene un dolor de cabeza que no desaparece después de tomar analgésicos. °Nota cambios en la visión o ve manchas delante de los ojos. °Tiene cólicos o siente presión o dolor leves en la parte baja del vientre. °Sigue sintiendo como si fuera a vomitar (náuseas), vomita o hace deposiciones acuosas (diarrea). °Advierte líquido con mal olor que proviene de la vagina. °Siente dolor al orinar o hace orina con mal olor. °Tiene una gran hinchazón en la cara, las manos, las piernas, los tobillos o los pies. °Tiene fiebre. °Solicite ayuda de inmediato si: °Tiene una pérdida de  líquido por la vagina. °Tiene sangrado o pequeñas pérdidas vaginales. °Tiene cólicos o dolor muy intensos en el vientre. °Tiene dificultad para respirar. °Sientes dolor en el pecho. °Se desmaya. °No ha sentido que el bebé se moviera durante el período de tiempo que le dijo el médico. °Tiene dolor, hinchazón o enrojecimiento nuevos en un brazo o una pierna o se produce un aumento de alguno de estos síntomas. °Resumen °El segundo trimestre de embarazo va desde la semana 13 hasta la 27 (desde el mes 4 hasta el 6). °Consuma comidas saludables. °Haga ejercicios tal como le indicó el médico. La mayoría de las personas pueden realizar su actividad física habitual durante el embarazo. °No consuma medicamentos a base de hierbas, drogas ilegales, ni medicamentos que el médico no haya autorizado. No beba alcohol. °Llame al médico si se enferma o si nota algo inusual acerca de su embarazo. °Esta información no tiene como fin reemplazar el consejo del médico. Asegúrese de hacerle al médico cualquier pregunta que tenga. °Document Revised: 03/19/2020 Document Reviewed: 03/19/2020 °Elsevier Patient Education © 2022 Elsevier Inc. ° °

## 2021-11-23 ENCOUNTER — Ambulatory Visit (INDEPENDENT_AMBULATORY_CARE_PROVIDER_SITE_OTHER): Payer: Self-pay | Admitting: General Practice

## 2021-11-23 ENCOUNTER — Other Ambulatory Visit (HOSPITAL_COMMUNITY)
Admission: RE | Admit: 2021-11-23 | Discharge: 2021-11-23 | Disposition: A | Discharge status: Home / Self Care | Payer: Self-pay | Source: Ambulatory Visit | Attending: Family Medicine | Admitting: Family Medicine

## 2021-11-23 ENCOUNTER — Other Ambulatory Visit: Payer: Self-pay

## 2021-11-23 VITALS — BP 135/85 | HR 79 | Ht 59.0 in | Wt 152.0 lb

## 2021-11-23 DIAGNOSIS — R300 Dysuria: Secondary | ICD-10-CM

## 2021-11-23 DIAGNOSIS — N898 Other specified noninflammatory disorders of vagina: Secondary | ICD-10-CM

## 2021-11-23 LAB — POCT URINALYSIS DIP (DEVICE)
Bilirubin Urine: NEGATIVE
Glucose, UA: NEGATIVE mg/dL
Hgb urine dipstick: NEGATIVE
Ketones, ur: NEGATIVE mg/dL
Nitrite: NEGATIVE
Protein, ur: NEGATIVE mg/dL
Specific Gravity, Urine: 1.02 (ref 1.005–1.030)
Urobilinogen, UA: 0.2 mg/dL (ref 0.0–1.0)
pH: 7 (ref 5.0–8.0)

## 2021-11-23 MED ORDER — FLUCONAZOLE 150 MG PO TABS: 150.0000 mg | ORAL_TABLET | Freq: Once | ORAL | 0 refills | Status: AC

## 2021-11-23 NOTE — Progress Notes (Signed)
Patient presents to office today reporting vaginal itching, discharge, & burning with urination x 2-3 days. Patient reports discharge is yellowish but denies odor. She states symptoms are similar to when she has had a yeast infection in the past. She also reports urinary frequency & incomplete emptying as well. UA collected and self swab specimen obtained from patient. Urine culture ordered as UA reveals small leuks. Diflucan sent to pharmacy per Dr Crissie Reese. Discussed with patient we will notify her of any abnormal results & any medications needed. Eda assisted with Spanish Interpretation.  ? ?Chase Caller RN BSN ?11/23/21 ? ?

## 2021-11-24 LAB — CERVICOVAGINAL ANCILLARY ONLY
Bacterial Vaginitis (gardnerella): NEGATIVE
Candida Glabrata: NEGATIVE
Candida Vaginitis: POSITIVE — AB
Comment: NEGATIVE
Comment: NEGATIVE
Comment: NEGATIVE
Comment: NEGATIVE
Trichomonas: NEGATIVE

## 2021-11-25 LAB — URINE CULTURE, OB REFLEX

## 2021-11-25 LAB — CULTURE, OB URINE

## 2021-11-30 ENCOUNTER — Other Ambulatory Visit: Payer: Self-pay | Admitting: Emergency Medicine

## 2021-11-30 DIAGNOSIS — O0990 Supervision of high risk pregnancy, unspecified, unspecified trimester: Secondary | ICD-10-CM

## 2021-11-30 DIAGNOSIS — I10 Essential (primary) hypertension: Secondary | ICD-10-CM

## 2021-11-30 MED ORDER — LABETALOL HCL 200 MG PO TABS: 200.0000 mg | ORAL_TABLET | Freq: Two times a day (BID) | ORAL | 2 refills | Status: DC

## 2021-12-06 NOTE — Progress Notes (Signed)
Patient was assessed and managed by nursing staff during this encounter. I have reviewed the chart and agree with the documentation and plan. I have also made any necessary editorial changes.  Scheryl Darter, MD 12/06/2021 2:09 PM

## 2021-12-15 ENCOUNTER — Other Ambulatory Visit: Payer: Self-pay | Admitting: General Practice

## 2021-12-15 DIAGNOSIS — O0990 Supervision of high risk pregnancy, unspecified, unspecified trimester: Secondary | ICD-10-CM

## 2021-12-16 ENCOUNTER — Ambulatory Visit: Payer: Self-pay | Attending: Obstetrics

## 2021-12-16 ENCOUNTER — Ambulatory Visit (INDEPENDENT_AMBULATORY_CARE_PROVIDER_SITE_OTHER): Payer: Self-pay | Admitting: Obstetrics and Gynecology

## 2021-12-16 ENCOUNTER — Ambulatory Visit: Payer: Self-pay | Admitting: *Deleted

## 2021-12-16 ENCOUNTER — Other Ambulatory Visit: Payer: Self-pay

## 2021-12-16 VITALS — BP 134/86 | HR 88 | Wt 152.9 lb

## 2021-12-16 VITALS — BP 130/77 | HR 87

## 2021-12-16 DIAGNOSIS — O43199 Other malformation of placenta, unspecified trimester: Secondary | ICD-10-CM

## 2021-12-16 DIAGNOSIS — O09522 Supervision of elderly multigravida, second trimester: Secondary | ICD-10-CM

## 2021-12-16 DIAGNOSIS — I10 Essential (primary) hypertension: Secondary | ICD-10-CM

## 2021-12-16 DIAGNOSIS — O10912 Unspecified pre-existing hypertension complicating pregnancy, second trimester: Secondary | ICD-10-CM

## 2021-12-16 DIAGNOSIS — O0990 Supervision of high risk pregnancy, unspecified, unspecified trimester: Secondary | ICD-10-CM

## 2021-12-16 DIAGNOSIS — O09299 Supervision of pregnancy with other poor reproductive or obstetric history, unspecified trimester: Secondary | ICD-10-CM

## 2021-12-16 DIAGNOSIS — Z363 Encounter for antenatal screening for malformations: Secondary | ICD-10-CM

## 2021-12-16 DIAGNOSIS — O10012 Pre-existing essential hypertension complicating pregnancy, second trimester: Secondary | ICD-10-CM

## 2021-12-16 DIAGNOSIS — Z98891 History of uterine scar from previous surgery: Secondary | ICD-10-CM

## 2021-12-16 DIAGNOSIS — Z23 Encounter for immunization: Secondary | ICD-10-CM

## 2021-12-16 DIAGNOSIS — Z789 Other specified health status: Secondary | ICD-10-CM

## 2021-12-16 DIAGNOSIS — O34219 Maternal care for unspecified type scar from previous cesarean delivery: Secondary | ICD-10-CM

## 2021-12-16 DIAGNOSIS — O09292 Supervision of pregnancy with other poor reproductive or obstetric history, second trimester: Secondary | ICD-10-CM

## 2021-12-16 DIAGNOSIS — Z3A27 27 weeks gestation of pregnancy: Secondary | ICD-10-CM

## 2021-12-16 DIAGNOSIS — O09899 Supervision of other high risk pregnancies, unspecified trimester: Secondary | ICD-10-CM

## 2021-12-16 DIAGNOSIS — O09212 Supervision of pregnancy with history of pre-term labor, second trimester: Secondary | ICD-10-CM

## 2021-12-16 DIAGNOSIS — R8271 Bacteriuria: Secondary | ICD-10-CM

## 2021-12-16 IMAGING — US US MFM OB FOLLOW-UP
1 series · 13 of 28 positions shown · non-contrast
Comparison: none

[Series 1: us mfm ob follow-up · 48 acquisitions, 13 frames shown]
[im 2/48]
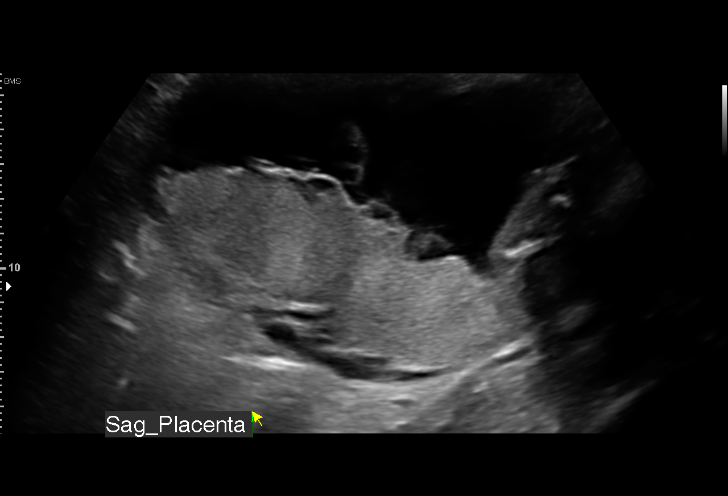
[im 6/48]
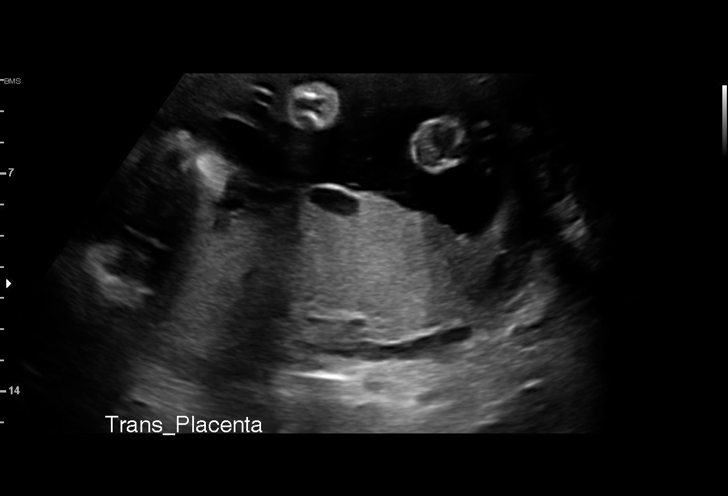
[im 9/48]
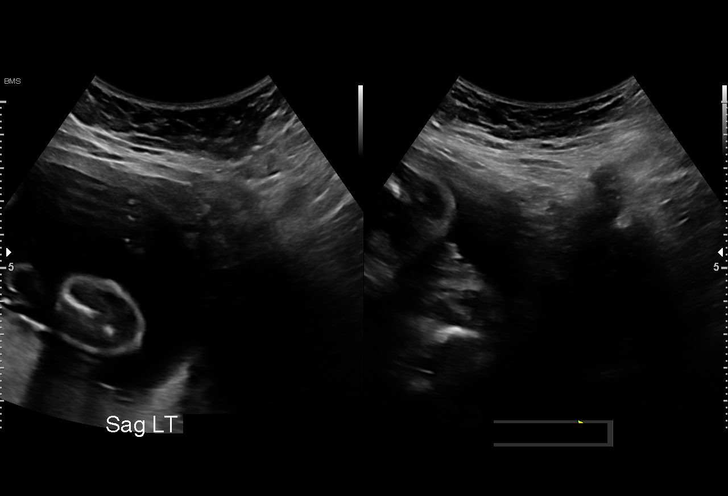
[im 13/48]
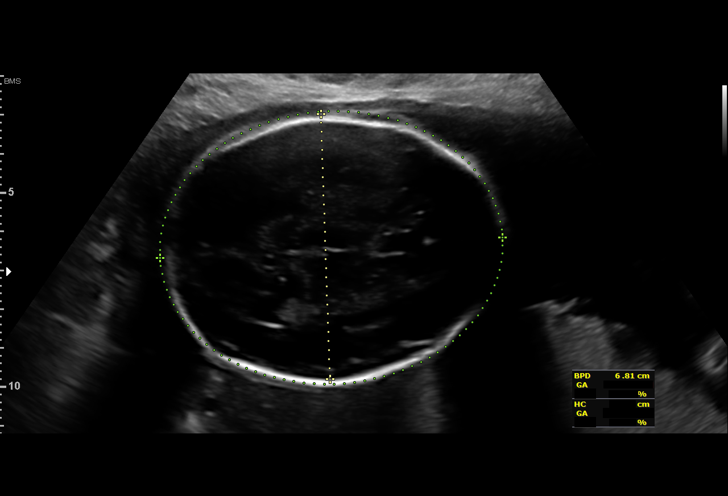
[im 16/48]
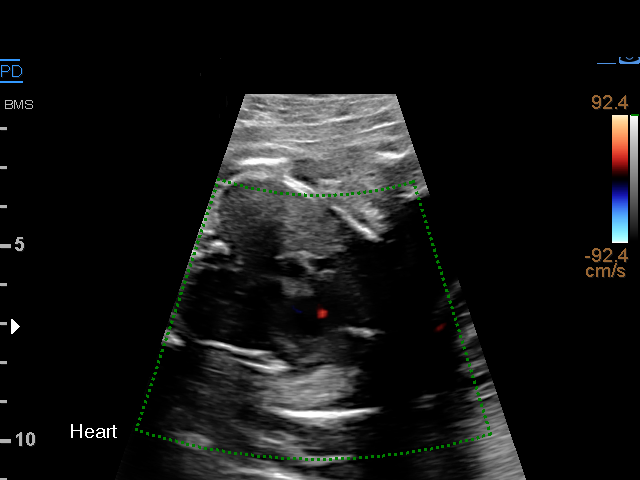
[im 20/48]
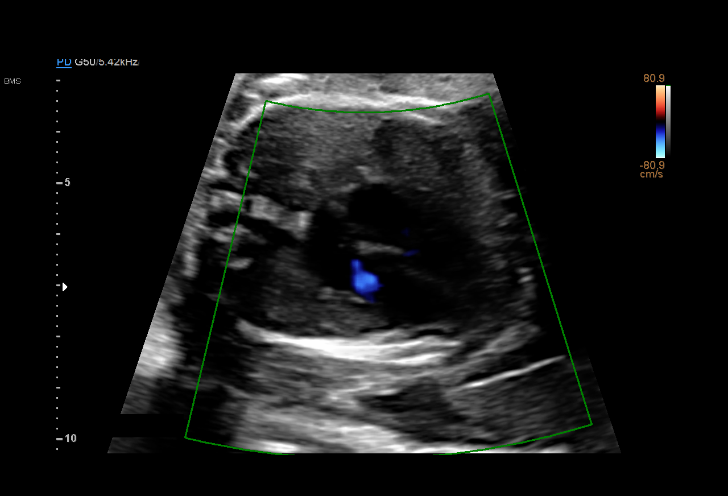
[im 25/48]
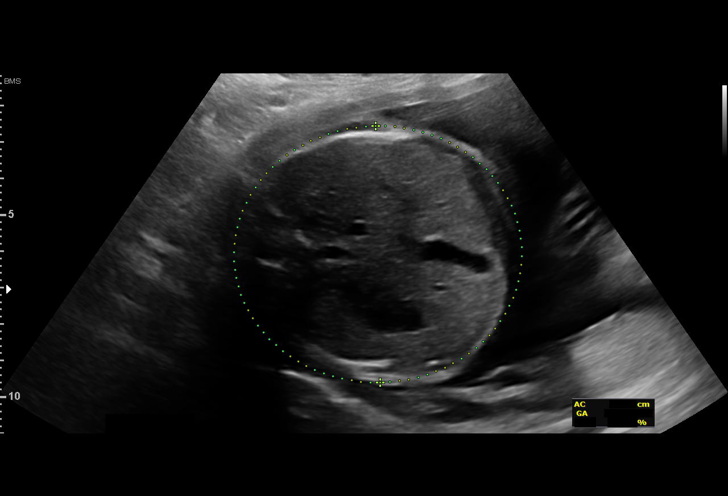
[im 28/48]
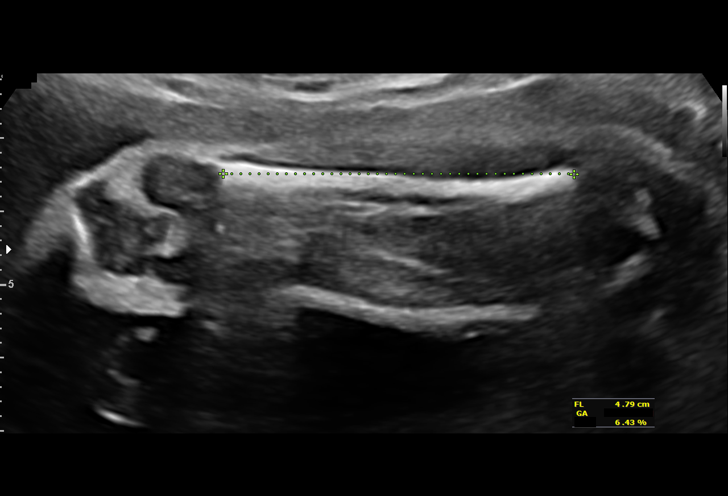
[im 32/48]
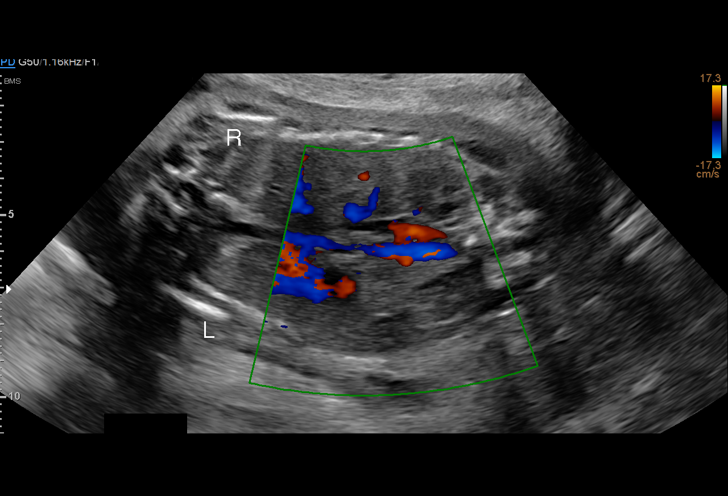
[im 35/48]
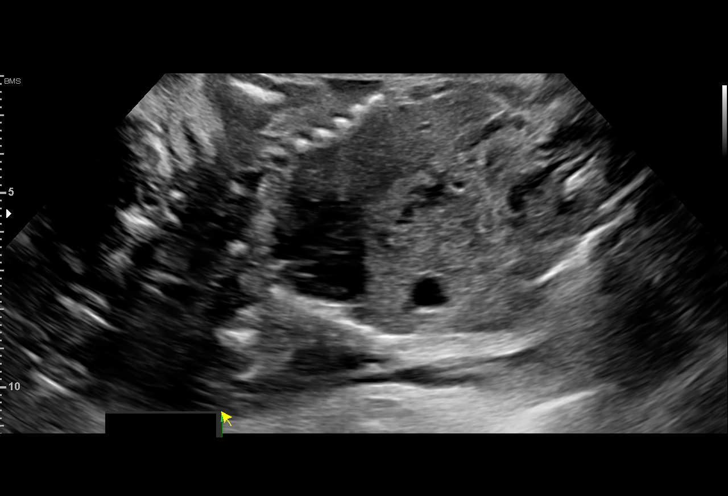
[im 39/48]
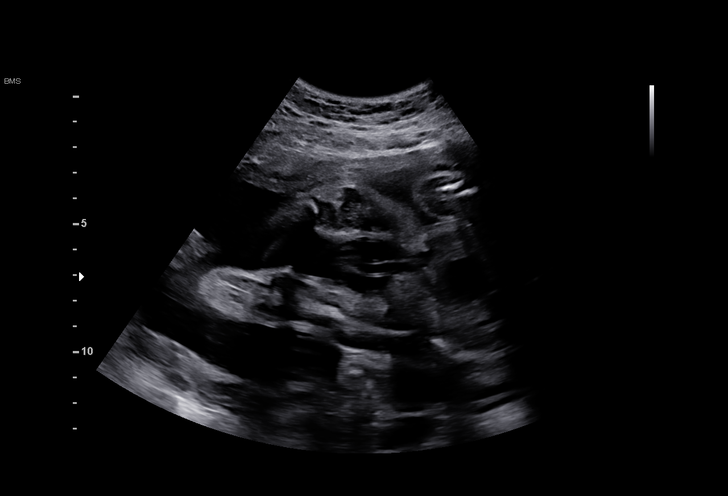
[im 42/48]
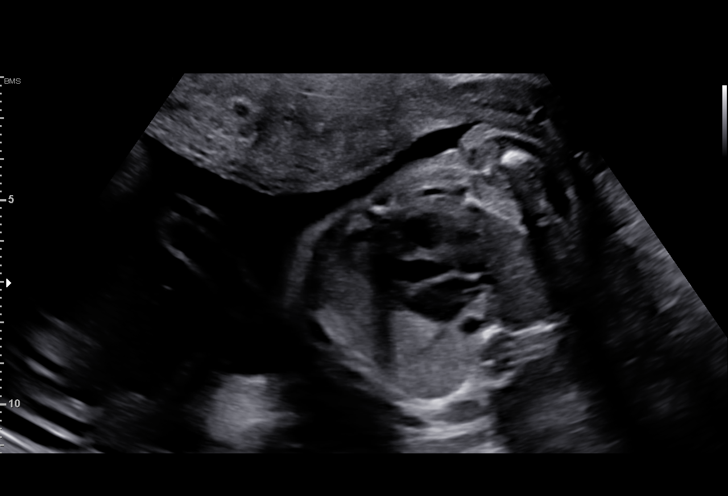
[im 46/48]
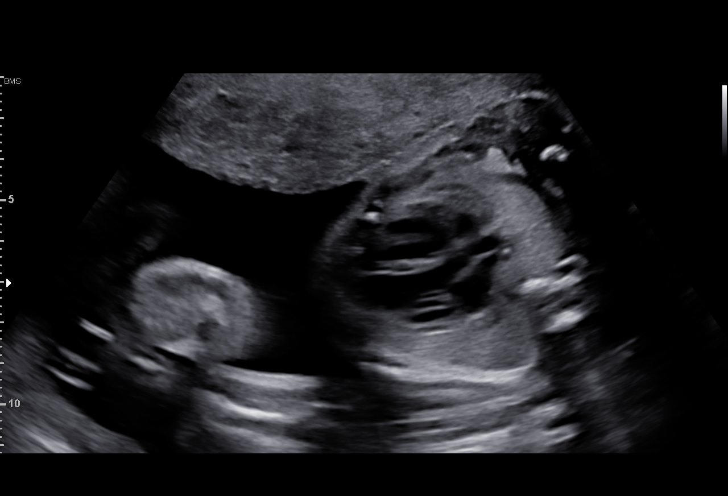

[13 of 28 positions shown; findings below may reference images not displayed]

Indications

 Hypertension - Chronic/Pre-existing            [OM]
 (labetalol)
 Advanced maternal age multigravida 35+,        [OM]
 second trimester (40 y.o)
 Poor obstetrical history (history of HELLP     [OM]
 syndrome)
 Poor obstetric history: Previous preterm       [OM]
 delivery, antepartum ([OM])
 History of cesarean delivery, currently        [OM]
 pregnant
 Encounter for antenatal screening for          [OM]
 malformations
 Negative AFP
 27 weeks gestation of pregnancy
Fetal Evaluation

 Num Of Fetuses:         1
 Fetal Heart Rate(bpm):  150
 Cardiac Activity:       Observed
 Presentation:           Breech
 Placenta:               Posterior
 P. Cord Insertion:      Marginal insertion

 Amniotic Fluid
 AFI FV:      Within normal limits

                             Largest Pocket(cm)

Biometry

 BPD:      68.1  mm     G. Age:  27w 3d         37  %    CI:        73.54   %    70 - 86
                                                         FL/HC:      19.1   %    18.6 -
 HC:      252.3  mm     G. Age:  27w 3d         21  %    HC/AC:      1.08        1.05 -
 AC:      233.9  mm     G. Age:  27w 5d         51  %    FL/BPD:     70.9   %    71 - 87
 FL:       48.3  mm     G. Age:  26w 1d          8  %    FL/AC:      20.6   %    20 - 24
 HUM:      43.8  mm     G. Age:  26w 0d         16  %
 LV:        6.3  mm

 Est. FW:    [OM]  gm      2 lb 4 oz     27  %
OB History

 Blood Type:   O+
 Gravidity:    5         Term:   1        Prem:   1        SAB:   2
 Living:       2
Gestational Age

 LMP:           27w 3d        Date:  [DATE]                 EDD:   [DATE]
 U/S Today:     27w 1d                                        EDD:   [DATE]
 Best:          27w 3d     Det. By:  LMP  ([DATE])          EDD:   [DATE]
Anatomy

 Cranium:               Appears normal         LVOT:                   Previously seen
 Cavum:                 Appears normal         Aortic Arch:            Previously seen
 Ventricles:            Appears normal         Ductal Arch:            Previously seen
 Choroid Plexus:        Previously seen        Diaphragm:              Appears normal
 Cerebellum:            Previously seen        Stomach:                Appears normal, left
                                                                       sided
 Posterior Fossa:       Previously seen        Abdomen:                Previously seen
 Nuchal Fold:           Previously seen        Abdominal Wall:         Previously seen
 Face:                  Orbits and profile     Cord Vessels:           Previously seen
                        previously seen
 Lips:                  Previously seen        Kidneys:                Appear normal
 Palate:                Not well visualized    Bladder:                Appears normal
 Thoracic:              Previously seen        Spine:                  Previously seen
 Heart:                 Previously seen        Upper Extremities:      Previously seen
 RVOT:                  Previously seen        Lower Extremities:      Previously seen

 Other:  Female gender previously seen. Nasal bone, lenses, maxilla,
         mandible, falx, VC, 3VV, 3VTV, Heels/feet and open hands/5th digits
         visualized previously.
Cervix Uterus Adnexa

 Cervix
 Not visualized (advanced GA >[OM])

 Uterus
 No abnormality visualized.

 Right Ovary
 Within normal limits.
 Left Ovary
 Not visualized.

 Cul De Sac
 No free fluid seen.

 Adnexa
 No adnexal mass visualized.
Comments

 This patient was seen for a follow up growth scan due to
 chronic hypertension treated with labetalol and advanced
 maternal age.  She denies any problems since her last exam.
 She was informed that the fetal growth and amniotic fluid
 level appears appropriate for her gestational age.
 Due to chronic hypertension treated with labetalol, we will
 start weekly fetal testing at 32 weeks.
 A follow-up growth scan and BPP was scheduled in 5 weeks.
 All conversations were held with the patient today with the
 help of a Spanish interpreter.
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
## 2021-12-17 LAB — CBC
Hematocrit: 34.9 % (ref 34.0–46.6)
Hemoglobin: 12 g/dL (ref 11.1–15.9)
MCH: 32.2 pg (ref 26.6–33.0)
MCHC: 34.4 g/dL (ref 31.5–35.7)
MCV: 94 fL (ref 79–97)
Platelets: 220 10*3/uL (ref 150–450)
RBC: 3.73 x10E6/uL — ABNORMAL LOW (ref 3.77–5.28)
RDW: 12.7 % (ref 11.7–15.4)
WBC: 10.7 10*3/uL (ref 3.4–10.8)

## 2021-12-17 LAB — HIV ANTIBODY (ROUTINE TESTING W REFLEX): HIV Screen 4th Generation wRfx: NONREACTIVE

## 2021-12-17 LAB — GLUCOSE TOLERANCE, 2 HOURS W/ 1HR
Glucose, 1 hour: 177 mg/dL (ref 70–179)
Glucose, 2 hour: 111 mg/dL (ref 70–152)
Glucose, Fasting: 89 mg/dL (ref 70–91)

## 2021-12-17 LAB — RPR: RPR Ser Ql: NONREACTIVE

## 2021-12-19 ENCOUNTER — Other Ambulatory Visit: Payer: Self-pay | Admitting: *Deleted

## 2021-12-19 DIAGNOSIS — O43192 Other malformation of placenta, second trimester: Secondary | ICD-10-CM

## 2021-12-19 DIAGNOSIS — O09892 Supervision of other high risk pregnancies, second trimester: Secondary | ICD-10-CM

## 2021-12-19 DIAGNOSIS — O10912 Unspecified pre-existing hypertension complicating pregnancy, second trimester: Secondary | ICD-10-CM

## 2021-12-19 DIAGNOSIS — O09522 Supervision of elderly multigravida, second trimester: Secondary | ICD-10-CM

## 2022-01-03 ENCOUNTER — Ambulatory Visit (INDEPENDENT_AMBULATORY_CARE_PROVIDER_SITE_OTHER): Payer: Self-pay | Admitting: Family Medicine

## 2022-01-03 VITALS — BP 144/91 | HR 82 | Wt 157.0 lb

## 2022-01-03 DIAGNOSIS — O43199 Other malformation of placenta, unspecified trimester: Secondary | ICD-10-CM

## 2022-01-03 DIAGNOSIS — O09299 Supervision of pregnancy with other poor reproductive or obstetric history, unspecified trimester: Secondary | ICD-10-CM

## 2022-01-03 DIAGNOSIS — Z789 Other specified health status: Secondary | ICD-10-CM

## 2022-01-03 DIAGNOSIS — Z98891 History of uterine scar from previous surgery: Secondary | ICD-10-CM

## 2022-01-03 DIAGNOSIS — R8271 Bacteriuria: Secondary | ICD-10-CM

## 2022-01-03 DIAGNOSIS — I10 Essential (primary) hypertension: Secondary | ICD-10-CM

## 2022-01-03 DIAGNOSIS — O0990 Supervision of high risk pregnancy, unspecified, unspecified trimester: Secondary | ICD-10-CM

## 2022-01-03 MED ORDER — LABETALOL HCL 300 MG PO TABS: 300.0000 mg | ORAL_TABLET | Freq: Two times a day (BID) | ORAL | 5 refills | Status: DC
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
## 2022-01-03 NOTE — Patient Instructions (Signed)
Eleccin del mtodo anticonceptivo Contraception Choices La anticoncepcin, o los mtodos anticonceptivos, hace referencia a los mtodos o dispositivos que evitan el embarazo. Mtodos hormonales  Implante anticonceptivo Un implante anticonceptivo consiste en un tubo delgado de plstico que contiene una hormona que evita el embarazo. Es diferente de un dispositivo intrauterino (DIU). Un mdico lo inserta en la parte superior del brazo. Los implantes pueden ser eficaces durante un mximo de 3 aos. Inyecciones de progestina sola Las inyecciones de progestina sola contienen progestina, una forma sinttica de la hormona progesterona. Un mdico las administra cada 3 meses. Pldoras anticonceptivas Las pldoras anticonceptivas son pastillas que contienen hormonas que evitan el embarazo. Deben tomarse una vez al da, preferentemente a la misma hora cada da. Se necesita una receta para utilizar este mtodo anticonceptivo. Parche anticonceptivo El parche anticonceptivo contiene hormonas que evitan el embarazo. Se coloca en la piel, debe cambiarse una vez a la semana durante tres semanas y debe retirarse en la cuarta semana. Se necesita una receta para utilizar este mtodo anticonceptivo. Anillo vaginal Un anillo vaginal contiene hormonas que evitan el embarazo. Se coloca en la vagina durante tres semanas y se retira en la cuarta semana. Luego se repite el proceso con un anillo nuevo. Se necesita una receta para utilizar este mtodo anticonceptivo. Anticonceptivo de emergencia Los anticonceptivos de emergencia son mtodos para evitar un embarazo despus de tener sexo sin proteccin. Vienen en forma de pldora y pueden tomarse hasta 5 das despus de tener sexo. Funcionan mejor cuando se toman lo ms pronto posible luego de tener sexo. La mayora de los anticonceptivos de emergencia estn disponibles sin receta mdica. Este mtodo no debe utilizarse como el nico mtodo anticonceptivo. Mtodos de  barrera  Condn masculino Un condn masculino es una vaina delgada que se coloca sobre el pene durante el sexo. Los condones evitan que el esperma ingrese en el cuerpo de la mujer. Pueden utilizarse con un una sustancia que mata a los espermatozoides (espermicida) para aumentar la efectividad. Deben desecharse despus de un uso. Condn femenino Un condn femenino es una vaina blanda y holgada que se coloca en la vagina antes de tener sexo. El condn evita que el esperma ingrese en el cuerpo de la mujer. Deben desecharse despus de un uso. Diafragma Un diafragma es una barrera blanda con forma de cpula. Se inserta en la vagina antes del sexo, junto con un espermicida. El diafragma bloquea el ingreso de esperma en el tero, y el espermicida mata a los espermatozoides. El diafragma debe permanecer en la vagina durante 6 a 8 horas despus de tener sexo y debe retirarse en el plazo de las 24 horas. Un diafragma es recetado y colocado por un mdico. Debe reemplazarse cada 1 a 2 aos, despus de dar a luz, de aumentar ms de 15lb (6.8kg) y de una ciruga plvica. Capuchn cervical Un capuchn cervical es una copa redonda y blanda de ltex o plstico que se coloca en el cuello uterino. Se inserta en la vagina antes del sexo, junto con un espermicida. Bloquea el ingreso del esperma en el tero. El capuchn debe permanecer en el lugar durante 6 a 8 horas despus de tener sexo y debe retirarse en el plazo de las 48 horas. Un capuchn cervical debe ser recetado y colocado por un mdico. Debe reemplazarse cada 2aos. Esponja Una esponja es una pieza blanda y circular de espuma de poliuretano que contiene espermicida. La esponja ayuda a bloquear el ingreso de esperma en el tero, y el espermicida   mata a los espermatozoides. Para utilizarla, debe humedecerla e insertarla en la vagina. Debe insertarse antes de tener sexo, debe permanecer dentro al menos durante 6 horas despus de tener sexo y debe retirarse y  desecharse en el plazo de las 30 horas. Espermicidas Los espermicidas son sustancias qumicas que matan o bloquean al esperma y no lo dejan ingresar al cuello uterino y al tero. Vienen en forma de crema, gel, supositorio, espuma o comprimido. Un espermicida debe insertarse en la vagina con un aplicador al menos 10 o 15 minutos antes de tener sexo para dar tiempo a que surta efecto. El proceso debe repetirse cada vez que tenga sexo. Los espermicidas no requieren receta mdica. Anticonceptivos intrauterinos Dispositivo intrauterino (DIU) Un DIU es un dispositivo en forma de T que se coloca en el tero. Existen dos tipos: DIU hormonal.Este tipo contiene progestina, una forma sinttica de la hormona progesterona. Este tipo puede permanecer colocado durante 3 a 5 aos. DIU de cobre.Este tipo est recubierto con un alambre de cobre. Puede permanecer colocado durante 10 aos. Mtodos anticonceptivos permanentes Ligadura de trompas en la mujer En este mtodo, se sellan, atan u obstruyen las trompas de Falopio durante una ciruga para evitar que el vulo descienda hacia el tero. Esterilizacin histeroscpica En este mtodo, se coloca un implante pequeo y flexible dentro de cada trompa de Falopio. Los implantes hacen que se forme un tejido cicatricial en las trompas de Falopio y que las obstruya para que el espermatozoide no pueda llegar al vulo. El procedimiento demora alrededor de 3 meses para que sea efectivo. Debe utilizarse otro mtodo anticonceptivo durante esos 3 meses. Esterilizacin masculina Este es un procedimiento que consiste en atar los conductos que transportan el esperma (vasectoma). Luego del procedimiento, el hombre puede eyacular lquido (semen). Debe utilizarse otro mtodo anticonceptivo durante 3 meses despus del procedimiento. Mtodos de planificacin natural Planificacin familiar natural En este mtodo, la pareja no tiene sexo durante los das en que la mujer podra quedar  embarazada. Mtodo calendario En este mtodo, la mujer realiza un seguimiento de la duracin de cada ciclo menstrual, identifica los das en los que se puede producir un embarazo y no tiene sexo durante esos das. Mtodo de la ovulacin En este mtodo, la pareja evita tener sexo durante la ovulacin. Mtodo sintotrmico Este mtodo implica no tener sexo durante la ovulacin. Normalmente, la mujer comprueba la ovulacin al observar cambios en su temperatura y en la consistencia del moco cervical. Mtodo posovulacin En este mtodo, la pareja espera a que finalice la ovulacin para tener sexo. Dnde buscar ms informacin Centers for Disease Control and Prevention (Centros para el Control y la Prevencin de Enfermedades): www.cdc.gov Resumen La anticoncepcin, o los mtodos anticonceptivos, hace referencia a los mtodos o dispositivos que evitan el embarazo. Los mtodos anticonceptivos hormonales incluyen implantes, inyecciones, pastillas, parches, anillos vaginales y anticonceptivos de emergencia. Los mtodos anticonceptivos de barrera pueden incluir condones masculinos, condones femeninos, diafragmas, capuchones cervicales, esponjas y espermicidas. Existen dos tipos de DIU (dispositivo intrauterino). Un DIU puede colocarse en el tero de una mujer para evitar el embarazo durante 3 a 5 aos. La esterilizacin permanente puede realizarse mediante un procedimiento tanto en los hombres como en las mujeres. Los mtodos de planificacin familiar natural implican no tener sexo durante los das en que la mujer podra quedar embarazada. Esta informacin no tiene como fin reemplazar el consejo del mdico. Asegrese de hacerle al mdico cualquier pregunta que tenga. Document Revised: 04/06/2020 Document Reviewed: 04/06/2020 Elsevier Patient Education    2023 [employer]vier Inc.  

## 2022-01-04 ENCOUNTER — Other Ambulatory Visit: Payer: Self-pay

## 2022-01-04 DIAGNOSIS — O09299 Supervision of pregnancy with other poor reproductive or obstetric history, unspecified trimester: Secondary | ICD-10-CM

## 2022-01-05 LAB — CBC
Hematocrit: 30.7 % — ABNORMAL LOW (ref 34.0–46.6)
Hemoglobin: 10.7 g/dL — ABNORMAL LOW (ref 11.1–15.9)
MCH: 31.6 pg (ref 26.6–33.0)
MCHC: 34.9 g/dL (ref 31.5–35.7)
MCV: 91 fL (ref 79–97)
Platelets: 214 10*3/uL (ref 150–450)
RBC: 3.39 x10E6/uL — ABNORMAL LOW (ref 3.77–5.28)
RDW: 13 % (ref 11.7–15.4)
WBC: 8.7 10*3/uL (ref 3.4–10.8)

## 2022-01-05 LAB — COMPREHENSIVE METABOLIC PANEL
ALT: 10 IU/L (ref 0–32)
AST: 14 IU/L (ref 0–40)
Albumin/Globulin Ratio: 1.5 (ref 1.2–2.2)
Albumin: 3.4 g/dL — ABNORMAL LOW (ref 3.8–4.8)
Alkaline Phosphatase: 104 IU/L (ref 44–121)
BUN/Creatinine Ratio: 10 (ref 9–23)
BUN: 5 mg/dL — ABNORMAL LOW (ref 6–20)
Bilirubin Total: <0.2 mg/dL (ref 0.0–1.2)
CO2: 19 mmol/L — ABNORMAL LOW (ref 20–29)
Calcium: 8.8 mg/dL (ref 8.7–10.2)
Chloride: 100 mmol/L (ref 96–106)
Creatinine, Ser: 0.52 mg/dL — ABNORMAL LOW (ref 0.57–1.00)
Globulin, Total: 2.2 g/dL (ref 1.5–4.5)
Glucose: 125 mg/dL — ABNORMAL HIGH (ref 70–99)
Potassium: 3.8 mmol/L (ref 3.5–5.2)
Sodium: 132 mmol/L — ABNORMAL LOW (ref 134–144)
Total Protein: 5.6 g/dL — ABNORMAL LOW (ref 6.0–8.5)
eGFR: 122 mL/min/{1.73_m2} (ref >59)

## 2022-01-05 LAB — PROTEIN / CREATININE RATIO, URINE
Creatinine, Urine: 24.2 mg/dL
Protein, Ur: <4 mg/dL

## 2022-01-19 ENCOUNTER — Other Ambulatory Visit: Payer: Self-pay

## 2022-01-19 ENCOUNTER — Ambulatory Visit (INDEPENDENT_AMBULATORY_CARE_PROVIDER_SITE_OTHER): Payer: Self-pay | Admitting: Family Medicine

## 2022-01-19 VITALS — BP 145/95 | HR 84 | Wt 157.0 lb

## 2022-01-19 DIAGNOSIS — O43199 Other malformation of placenta, unspecified trimester: Secondary | ICD-10-CM

## 2022-01-19 DIAGNOSIS — Z789 Other specified health status: Secondary | ICD-10-CM

## 2022-01-19 DIAGNOSIS — Z98891 History of uterine scar from previous surgery: Secondary | ICD-10-CM

## 2022-01-19 DIAGNOSIS — O0990 Supervision of high risk pregnancy, unspecified, unspecified trimester: Secondary | ICD-10-CM

## 2022-01-19 DIAGNOSIS — R8271 Bacteriuria: Secondary | ICD-10-CM

## 2022-01-19 DIAGNOSIS — I10 Essential (primary) hypertension: Secondary | ICD-10-CM

## 2022-01-19 MED ORDER — LABETALOL HCL 300 MG PO TABS
300.0000 mg | ORAL_TABLET | Freq: Two times a day (BID) | ORAL | 5 refills | Status: DC
  Filled 2022-01-19: qty 60, 30d supply, fill #0
  Filled 2022-03-03: qty 60, 30d supply, fill #1
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
## 2022-01-20 ENCOUNTER — Ambulatory Visit: Payer: Self-pay | Admitting: *Deleted

## 2022-01-20 ENCOUNTER — Ambulatory Visit: Payer: Self-pay | Attending: Obstetrics

## 2022-01-20 VITALS — BP 132/77 | HR 83

## 2022-01-20 DIAGNOSIS — O34219 Maternal care for unspecified type scar from previous cesarean delivery: Secondary | ICD-10-CM

## 2022-01-20 DIAGNOSIS — O09293 Supervision of pregnancy with other poor reproductive or obstetric history, third trimester: Secondary | ICD-10-CM

## 2022-01-20 DIAGNOSIS — O10912 Unspecified pre-existing hypertension complicating pregnancy, second trimester: Secondary | ICD-10-CM

## 2022-01-20 DIAGNOSIS — O09892 Supervision of other high risk pregnancies, second trimester: Secondary | ICD-10-CM

## 2022-01-20 DIAGNOSIS — O43193 Other malformation of placenta, third trimester: Secondary | ICD-10-CM

## 2022-01-20 DIAGNOSIS — Z3A32 32 weeks gestation of pregnancy: Secondary | ICD-10-CM

## 2022-01-20 DIAGNOSIS — I10 Essential (primary) hypertension: Secondary | ICD-10-CM | POA: Insufficient documentation

## 2022-01-20 DIAGNOSIS — O09523 Supervision of elderly multigravida, third trimester: Secondary | ICD-10-CM

## 2022-01-20 DIAGNOSIS — O0990 Supervision of high risk pregnancy, unspecified, unspecified trimester: Secondary | ICD-10-CM

## 2022-01-20 DIAGNOSIS — O09522 Supervision of elderly multigravida, second trimester: Secondary | ICD-10-CM

## 2022-01-20 DIAGNOSIS — O10013 Pre-existing essential hypertension complicating pregnancy, third trimester: Secondary | ICD-10-CM

## 2022-01-20 DIAGNOSIS — O43192 Other malformation of placenta, second trimester: Secondary | ICD-10-CM

## 2022-01-20 IMAGING — US US MFM FETAL BPP W/O NON-STRESS
1 series · 14 of 28 positions shown · non-contrast
Comparison: none

[Series 1: us mfm fetal bpp w/o non-stress · 43 acquisitions, 14 frames shown]
[im 2/43]
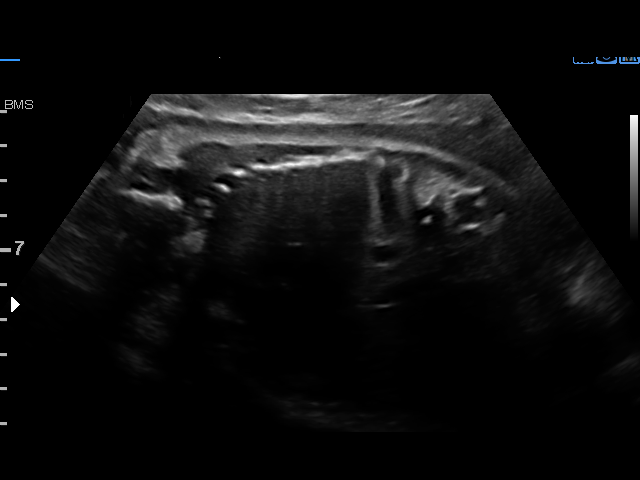
[im 5/43]
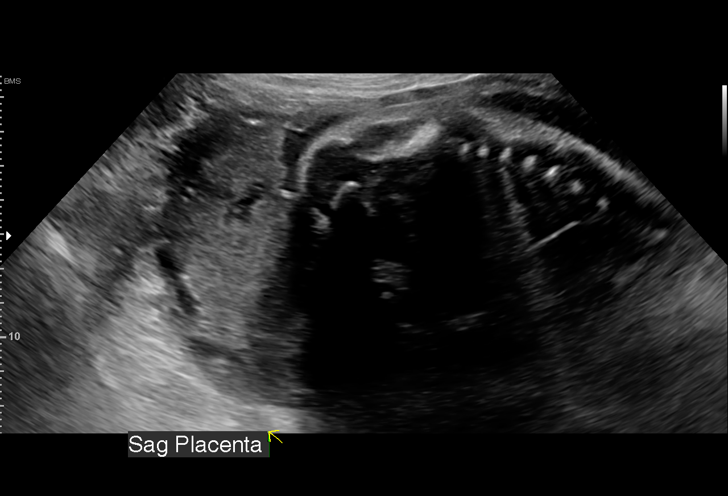
[im 8/43]
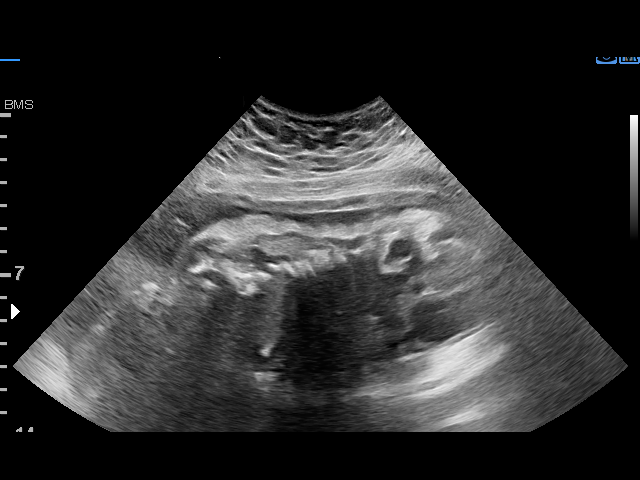
[im 11/43]
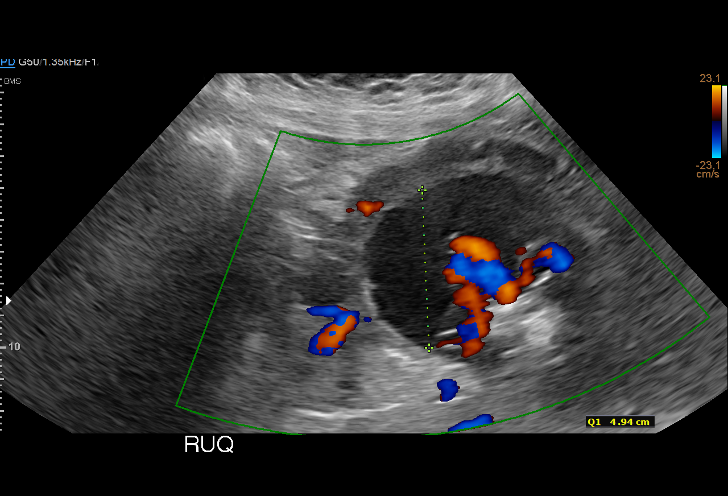
[im 15/43]
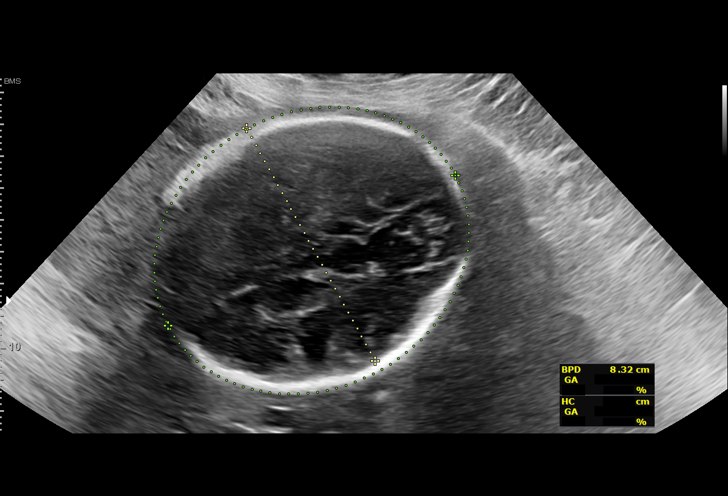
[im 18/43]
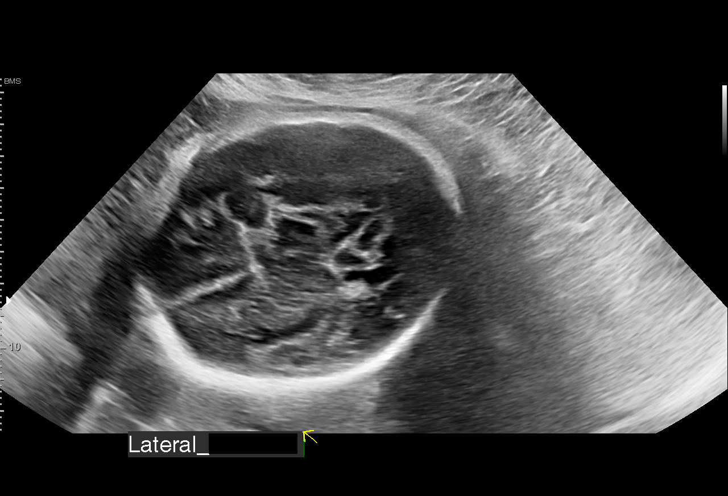
[im 21/43]
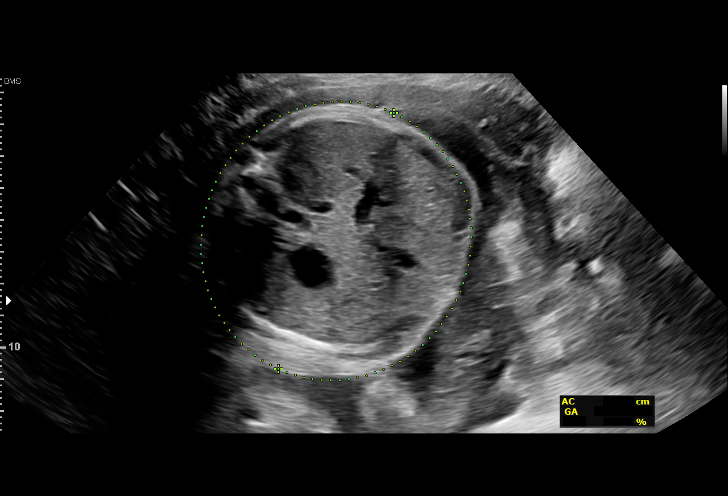
[im 24/43]
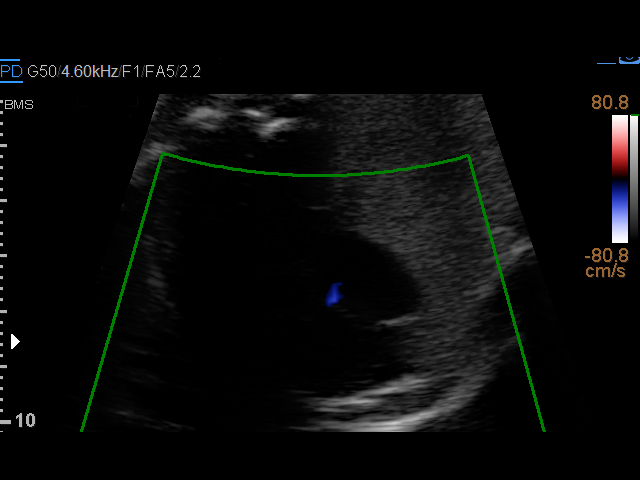
[im 27/43]
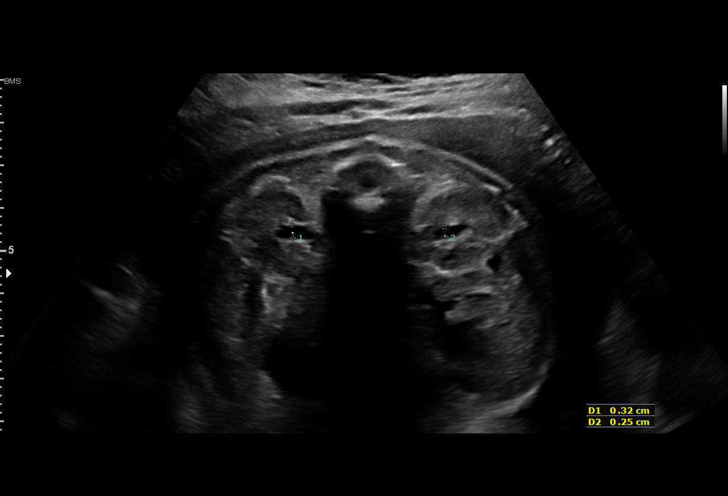
[im 30/43]
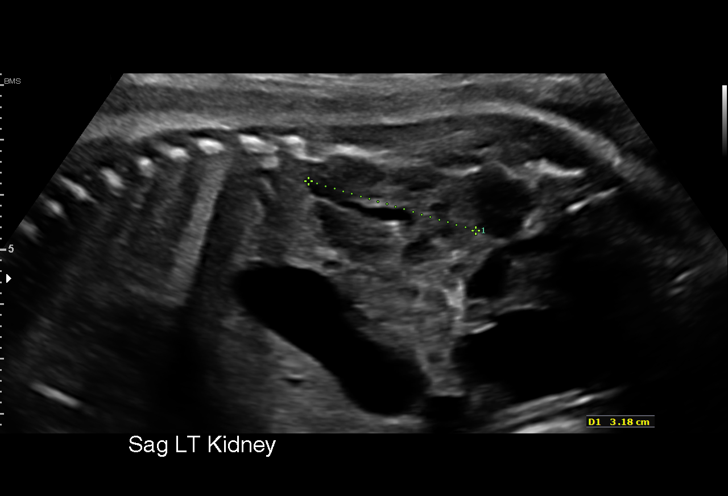
[im 33/43]
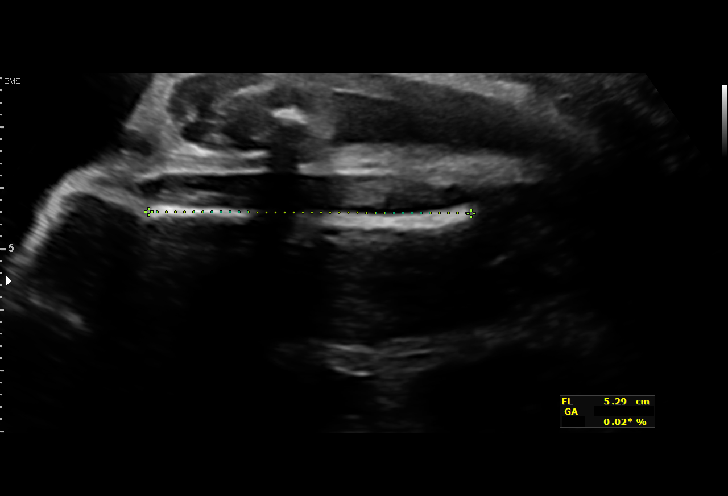
[im 36/43]
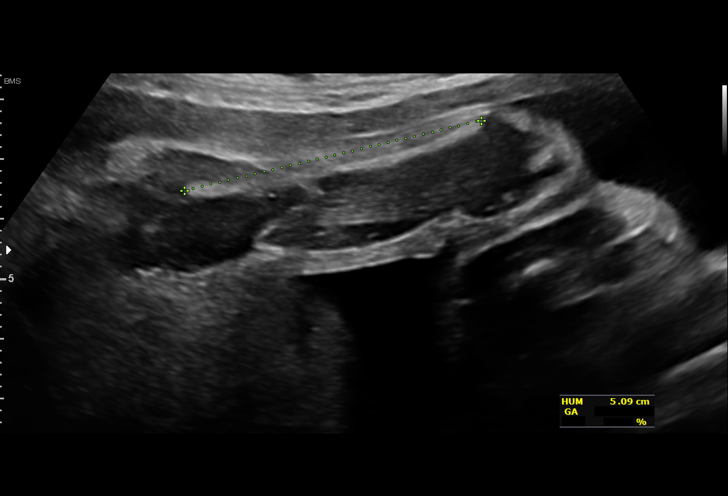
[im 39/43]
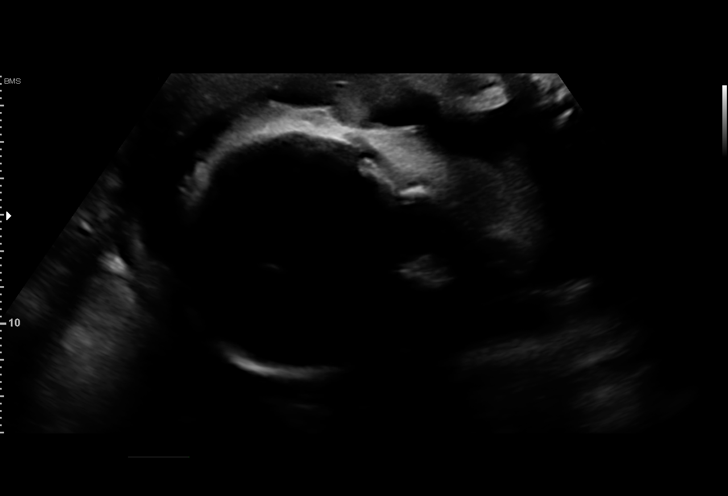
[im 43/43]
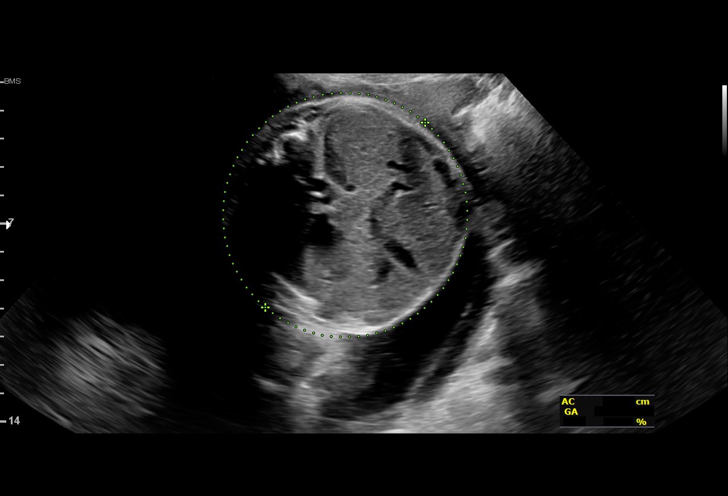

[14 of 28 positions shown; findings below may reference images not displayed]

Indications

 Hypertension - Chronic/Pre-existing
 (labetalol)
 Advanced maternal age multigravida 35+,
 second trimester (40 y.o)
 Poor obstetrical history (history of HELLP
 syndrome)
 Poor obstetric history: Previous preterm
 delivery, antepartum (14w4d)
 History of cesarean delivery, currently
 pregnant
 Encounter for antenatal screening for
 malformations
 32 weeks gestation of pregnancy
 Negative AFP
Fetal Evaluation

 Num Of Fetuses:         1
 Fetal Heart Rate(bpm):  150
 Cardiac Activity:       Observed
 Presentation:           Breech
 Placenta:               Posterior
 P. Cord Insertion:      Marginal insertion prev vis
 Amniotic Fluid
 AFI FV:      Within normal limits

 AFI Sum(cm)     %Tile       Largest Pocket(cm)
 15.17           54

 RUQ(cm)       RLQ(cm)       LUQ(cm)        LLQ(cm)

Biophysical Evaluation

 Amniotic F.V:   Within normal limits       F. Tone:        Observed
 F. Movement:    Observed                   Score:          [DATE]
 F. Breathing:   Observed
Biometry

 BPD:      83.9  mm     G. Age:  33w 5d         80  %    CI:        75.63   %    70 - 86
                                                         FL/HC:      18.4   %    19.1 -
 HC:      305.9  mm     G. Age:  34w 0d         57  %    HC/AC:      1.12        0.96 -
 AC:      272.3  mm     G. Age:  31w 2d         19  %    FL/BPD:     67.1   %    71 - 87
 FL:       56.3  mm     G. Age:  29w 4d        < 1  %    FL/AC:      20.7   %    20 - 24
 HUM:      50.9  mm     G. Age:  29w 6d        < 5  %

 Est. FW:    2462  gm    3 lb 13 oz      11  %
OB History

 Blood Type:   O+
 Gravidity:    5         Term:   1        Prem:   1        SAB:   2
 Living:       2
Gestational Age

 LMP:           32w 3d        Date:  06/07/21                 EDD:   03/14/22
 U/S Today:     32w 1d                                        EDD:   03/16/22
 Best:          32w 3d     Det. By:  LMP  (06/07/21)          EDD:   03/14/22
Anatomy

 Cranium:               Appears normal         LVOT:                   Previously seen
 Cavum:                 Appears normal         Aortic Arch:            Previously seen
 Ventricles:            Appears normal         Ductal Arch:            Previously seen
 Choroid Plexus:        Previously seen        Diaphragm:              Appears normal
 Cerebellum:            Previously seen        Stomach:                Appears normal, left
                                                                       sided
 Posterior Fossa:       Previously seen        Abdomen:                Previously seen
 Nuchal Fold:           Previously seen        Abdominal Wall:         Previously seen
 Face:                  Orbits and profile     Cord Vessels:           Previously seen
                        previously seen
 Lips:                  Previously seen        Kidneys:                Appear normal
 Palate:                Not well visualized    Bladder:                Appears normal
 Thoracic:              Previously seen        Spine:                  Previously seen
 Heart:                 Previously seen        Upper Extremities:      Previously seen
 RVOT:                  Previously seen        Lower Extremities:      Previously seen

 Other:  Female gender previously seen. Nasal bone, lenses, maxilla,
         mandible, falx, VC, 3VV, 3VTV, Heels/feet and open hands/5th digits
         visualized previously.
Cervix Uterus Adnexa

 Cervix
 Not visualized (advanced GA >33wks)
Impression

 Follow up growth due to chronic hypertension, AMA, and
 marginal cord insertion.
 Normal interval growth with measurements consistent with
 small for gestational age.
 Good fetal movement and amniotic fluid volume

 Blood pressure today 146/81 and 132/77 mmHg.
Recommendations

 Continue weekly testing.
 Repeat growth in 3 weeks.

## 2022-01-23 ENCOUNTER — Other Ambulatory Visit: Payer: Self-pay | Admitting: *Deleted

## 2022-01-23 DIAGNOSIS — O09299 Supervision of pregnancy with other poor reproductive or obstetric history, unspecified trimester: Secondary | ICD-10-CM

## 2022-01-23 DIAGNOSIS — O09899 Supervision of other high risk pregnancies, unspecified trimester: Secondary | ICD-10-CM

## 2022-01-23 DIAGNOSIS — O34219 Maternal care for unspecified type scar from previous cesarean delivery: Secondary | ICD-10-CM

## 2022-01-23 DIAGNOSIS — O43199 Other malformation of placenta, unspecified trimester: Secondary | ICD-10-CM

## 2022-01-23 DIAGNOSIS — O10913 Unspecified pre-existing hypertension complicating pregnancy, third trimester: Secondary | ICD-10-CM

## 2022-01-26 ENCOUNTER — Ambulatory Visit (INDEPENDENT_AMBULATORY_CARE_PROVIDER_SITE_OTHER): Payer: Self-pay | Admitting: Family Medicine

## 2022-01-26 VITALS — BP 142/87 | HR 74 | Wt 157.0 lb

## 2022-01-26 DIAGNOSIS — I10 Essential (primary) hypertension: Secondary | ICD-10-CM

## 2022-01-26 DIAGNOSIS — Z98891 History of uterine scar from previous surgery: Secondary | ICD-10-CM

## 2022-01-26 DIAGNOSIS — Z789 Other specified health status: Secondary | ICD-10-CM

## 2022-01-26 DIAGNOSIS — R8271 Bacteriuria: Secondary | ICD-10-CM

## 2022-01-26 DIAGNOSIS — O43199 Other malformation of placenta, unspecified trimester: Secondary | ICD-10-CM

## 2022-01-26 DIAGNOSIS — O0990 Supervision of high risk pregnancy, unspecified, unspecified trimester: Secondary | ICD-10-CM

## 2022-01-27 ENCOUNTER — Encounter: Payer: Self-pay | Admitting: *Deleted

## 2022-01-27 ENCOUNTER — Ambulatory Visit: Payer: Self-pay | Attending: Obstetrics

## 2022-01-27 ENCOUNTER — Ambulatory Visit: Payer: Self-pay | Admitting: *Deleted

## 2022-01-27 VITALS — BP 135/84 | HR 81

## 2022-01-27 DIAGNOSIS — O0990 Supervision of high risk pregnancy, unspecified, unspecified trimester: Secondary | ICD-10-CM

## 2022-01-27 DIAGNOSIS — O09293 Supervision of pregnancy with other poor reproductive or obstetric history, third trimester: Secondary | ICD-10-CM

## 2022-01-27 DIAGNOSIS — O09899 Supervision of other high risk pregnancies, unspecified trimester: Secondary | ICD-10-CM | POA: Insufficient documentation

## 2022-01-27 DIAGNOSIS — O09299 Supervision of pregnancy with other poor reproductive or obstetric history, unspecified trimester: Secondary | ICD-10-CM | POA: Insufficient documentation

## 2022-01-27 DIAGNOSIS — I10 Essential (primary) hypertension: Secondary | ICD-10-CM | POA: Insufficient documentation

## 2022-01-27 DIAGNOSIS — Z3A33 33 weeks gestation of pregnancy: Secondary | ICD-10-CM

## 2022-01-27 DIAGNOSIS — O34219 Maternal care for unspecified type scar from previous cesarean delivery: Secondary | ICD-10-CM

## 2022-01-27 DIAGNOSIS — O10013 Pre-existing essential hypertension complicating pregnancy, third trimester: Secondary | ICD-10-CM

## 2022-01-27 DIAGNOSIS — O43199 Other malformation of placenta, unspecified trimester: Secondary | ICD-10-CM | POA: Insufficient documentation

## 2022-01-27 DIAGNOSIS — O09213 Supervision of pregnancy with history of pre-term labor, third trimester: Secondary | ICD-10-CM

## 2022-01-27 DIAGNOSIS — O09523 Supervision of elderly multigravida, third trimester: Secondary | ICD-10-CM

## 2022-01-27 DIAGNOSIS — O10913 Unspecified pre-existing hypertension complicating pregnancy, third trimester: Secondary | ICD-10-CM | POA: Insufficient documentation

## 2022-01-27 IMAGING — US US MFM FETAL BPP W/O NON-STRESS
1 series · 12 of 13 positions shown · non-contrast
Comparison: none

[Series 1: us mfm fetal bpp w/o non-stress · 13 acquisitions, 12 frames shown]
[im 1/13]
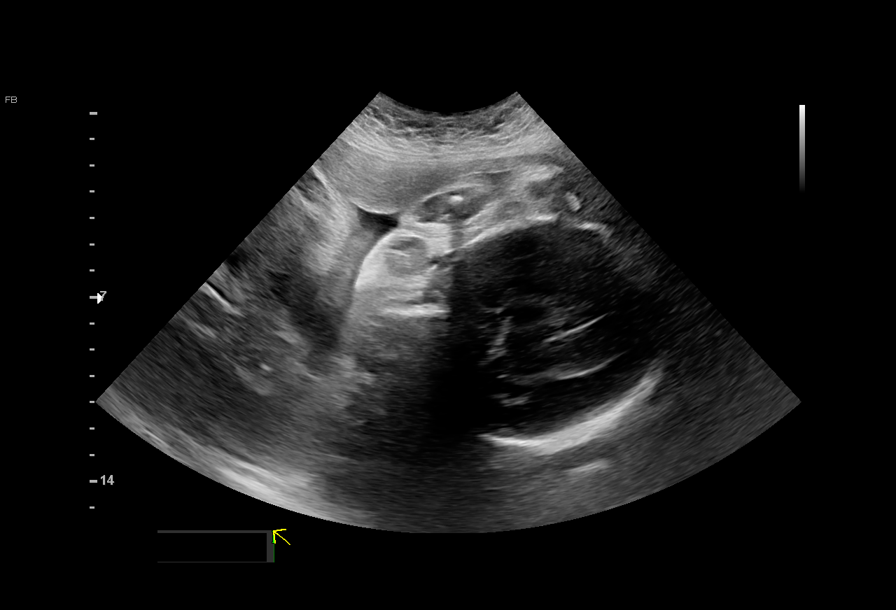
[im 2/13]
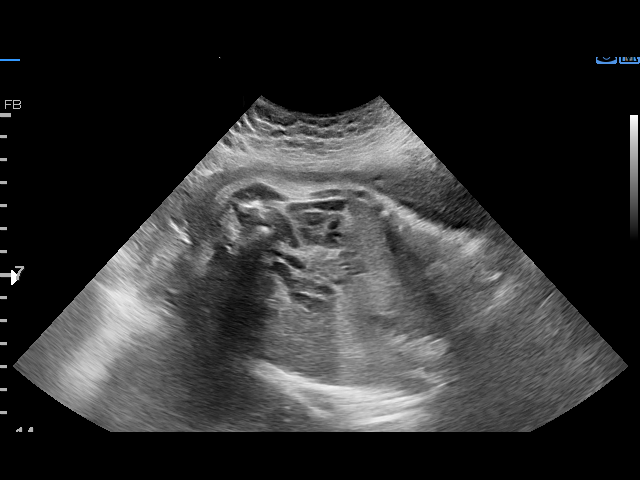
[im 3/13]
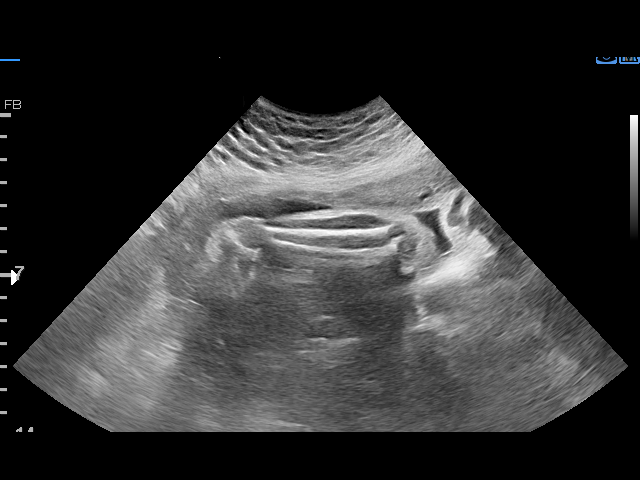
[im 4/13]
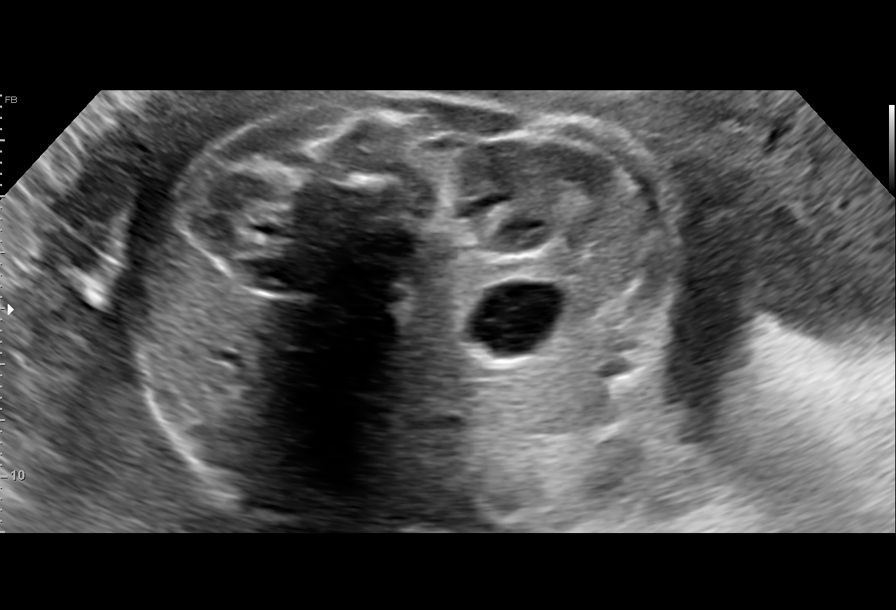
[im 5/13]
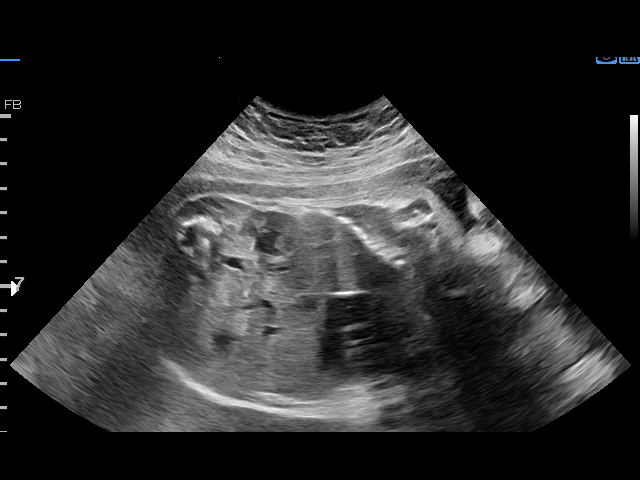
[im 6/13]
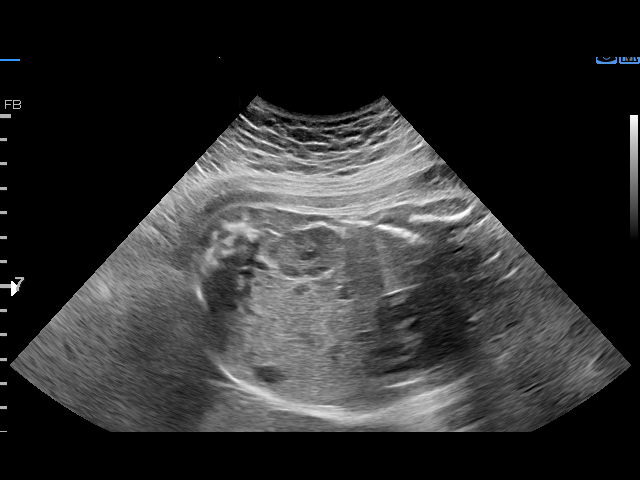
[im 8/13]
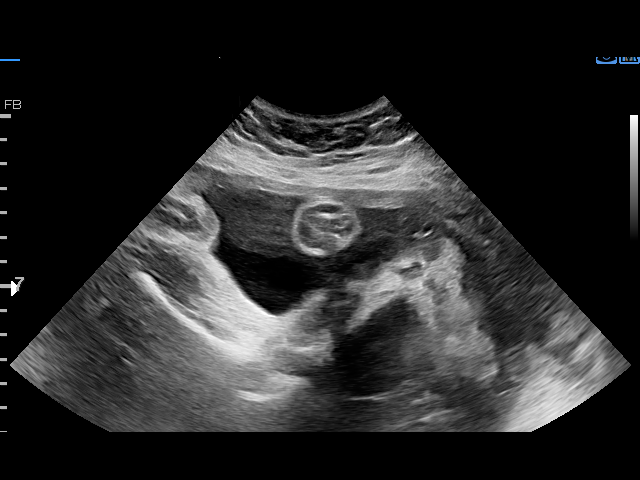
[im 9/13]
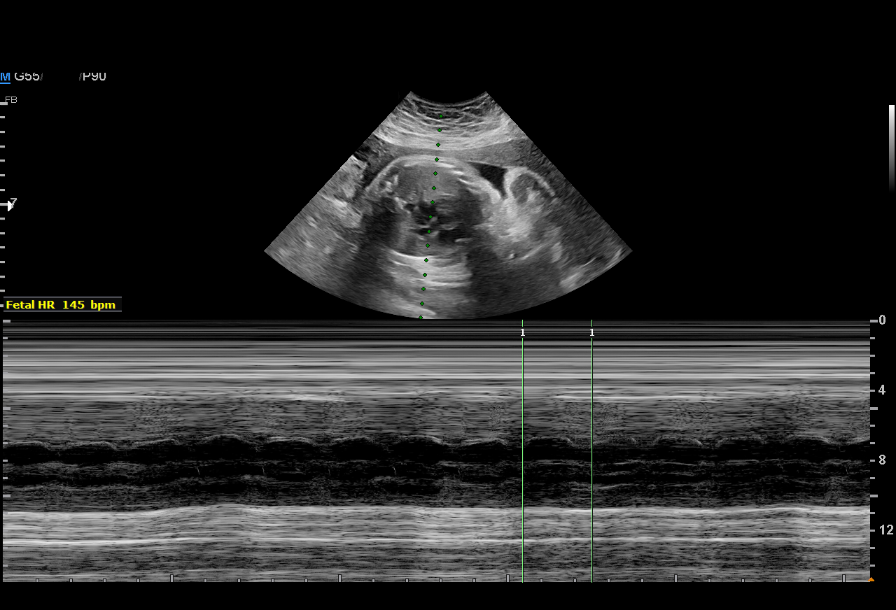
[im 10/13]
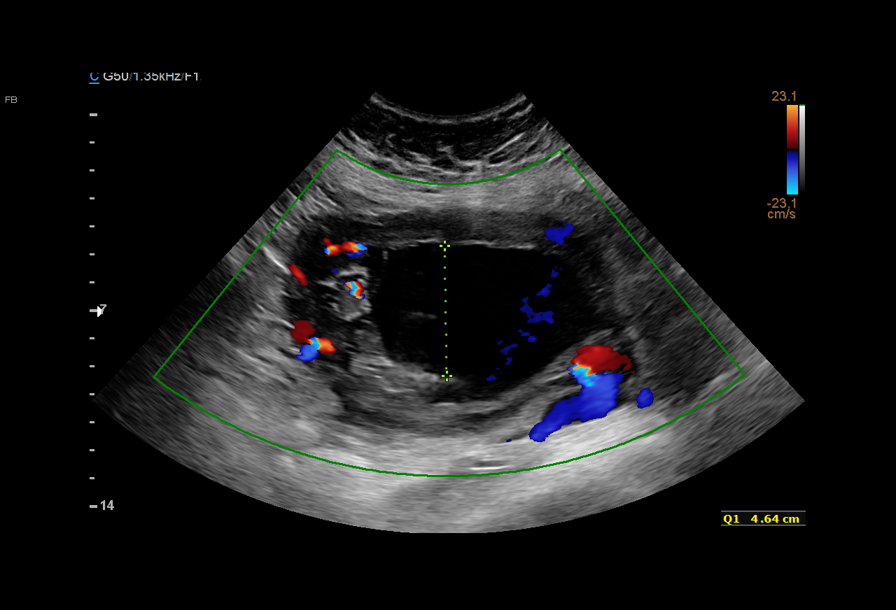
[im 11/13]
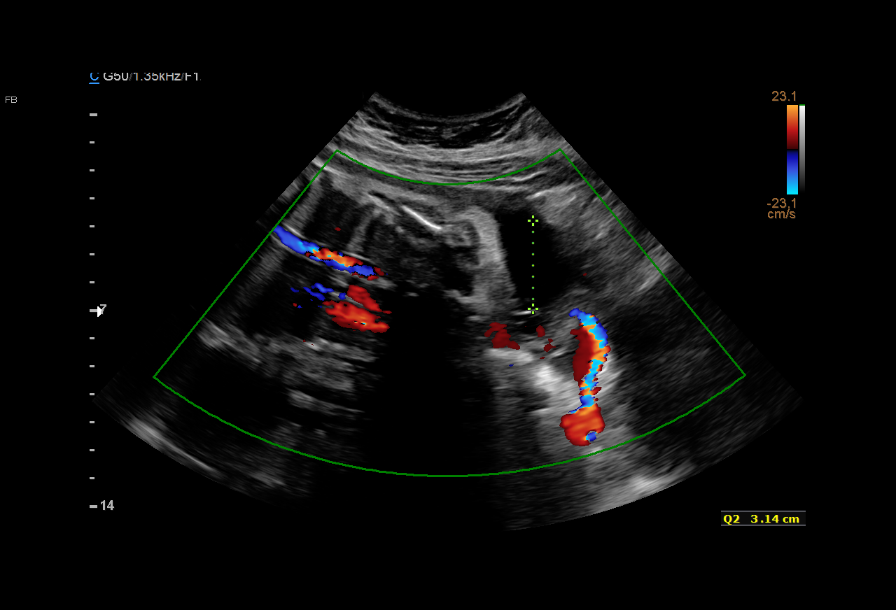
[im 12/13]
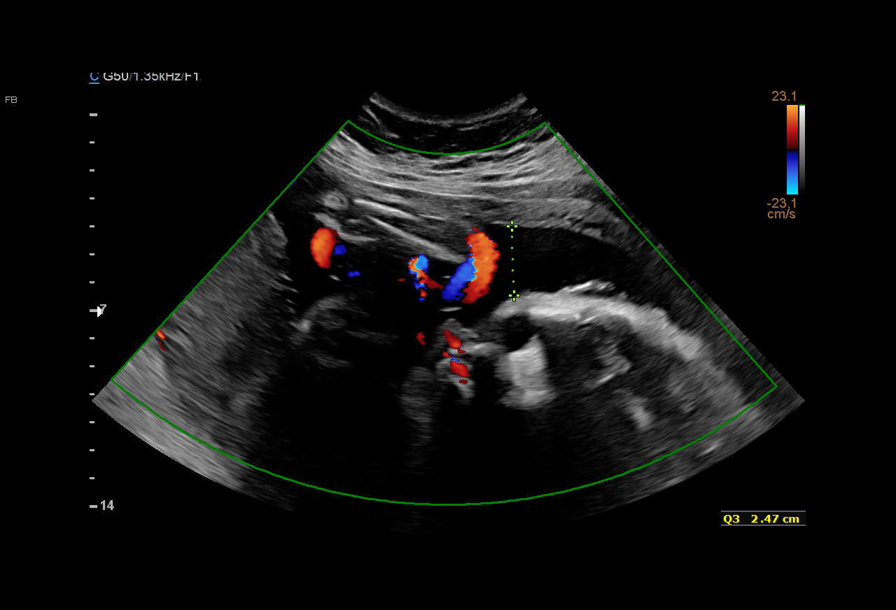
[im 13/13]
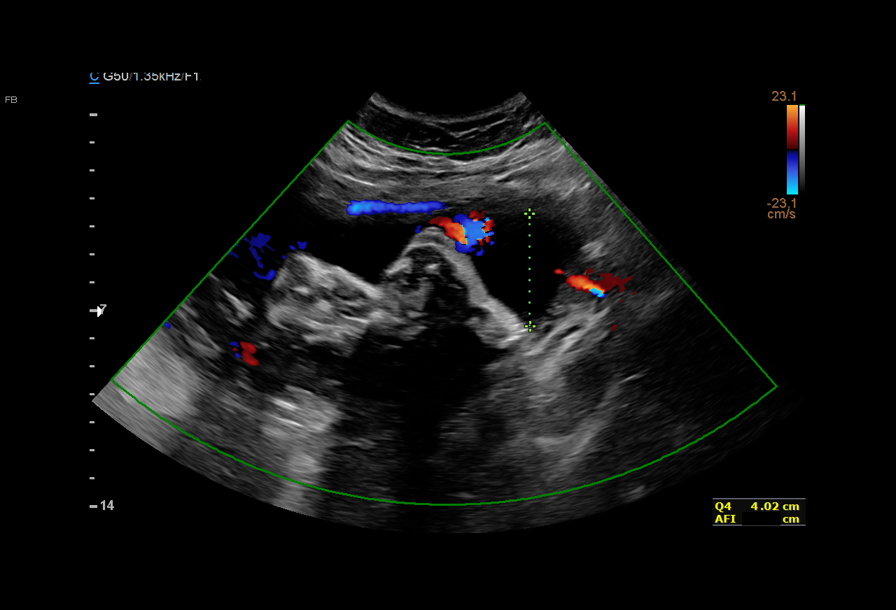

[12 of 13 positions shown; findings below may reference images not displayed]

Indications

 Hypertension - Chronic/Pre-existing
 (labetalol)
 Advanced maternal age multigravida 35+,
 third trimester (40 y.o)
 Poor obstetrical history (history of HELLP
 syndrome)
 Poor obstetric history: Previous preterm
 delivery, antepartum (50w0d)
 History of cesarean delivery, currently
 pregnant
 33 weeks gestation of pregnancy
 Negative AFP
Fetal Evaluation

 Num Of Fetuses:         1
 Fetal Heart Rate(bpm):  145
 Cardiac Activity:       Observed
 Presentation:           Cephalic
 Placenta:               Posterior
 P. Cord Insertion:      Marginal insertion prev seen

 Amniotic Fluid
 AFI FV:      Within normal limits

 AFI Sum(cm)     %Tile       Largest Pocket(cm)
 14.2            50
 RUQ(cm)       RLQ(cm)       LUQ(cm)        LLQ(cm)
 4.6           4
Biophysical Evaluation

 Amniotic F.V:   Pocket => 2 cm             F. Tone:        Observed
 F. Movement:    Observed                   Score:          [DATE]
 F. Breathing:   Observed
OB History

 Blood Type:   O+
 Gravidity:    5         Term:   1        Prem:   1        SAB:   2
 Living:       2
Gestational Age

 LMP:           33w 3d        Date:  06/07/21                 EDD:   03/14/22
 Best:          33w 3d     Det. By:  LMP  (06/07/21)          EDD:   03/14/22
Comments

 This patient was seen for a  BPP due to chronic hypertension
 treated with labetalol and advanced maternal age.  She
 denies any problems since her last exam.
 A BPP performed today was [DATE].
 There was normal amniotic fluid noted.
  She will return in 1 week for another BPP.
 All conversations were held with the patient today with the
 help of a Spanish interpreter.
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
## 2022-02-03 ENCOUNTER — Ambulatory Visit: Payer: Self-pay | Attending: Obstetrics & Gynecology | Admitting: *Deleted

## 2022-02-03 ENCOUNTER — Ambulatory Visit (INDEPENDENT_AMBULATORY_CARE_PROVIDER_SITE_OTHER): Payer: Self-pay | Admitting: Obstetrics and Gynecology

## 2022-02-03 ENCOUNTER — Encounter: Payer: Self-pay | Admitting: Obstetrics and Gynecology

## 2022-02-03 ENCOUNTER — Ambulatory Visit (HOSPITAL_BASED_OUTPATIENT_CLINIC_OR_DEPARTMENT_OTHER): Payer: Self-pay | Admitting: *Deleted

## 2022-02-03 VITALS — BP 148/98 | HR 78 | Wt 159.2 lb

## 2022-02-03 VITALS — BP 129/83 | HR 85

## 2022-02-03 DIAGNOSIS — Z789 Other specified health status: Secondary | ICD-10-CM

## 2022-02-03 DIAGNOSIS — Z3A34 34 weeks gestation of pregnancy: Secondary | ICD-10-CM

## 2022-02-03 DIAGNOSIS — O0990 Supervision of high risk pregnancy, unspecified, unspecified trimester: Secondary | ICD-10-CM

## 2022-02-03 DIAGNOSIS — I10 Essential (primary) hypertension: Secondary | ICD-10-CM

## 2022-02-03 DIAGNOSIS — O10913 Unspecified pre-existing hypertension complicating pregnancy, third trimester: Secondary | ICD-10-CM

## 2022-02-03 DIAGNOSIS — Z98891 History of uterine scar from previous surgery: Secondary | ICD-10-CM

## 2022-02-03 DIAGNOSIS — O43199 Other malformation of placenta, unspecified trimester: Secondary | ICD-10-CM

## 2022-02-03 DIAGNOSIS — R8271 Bacteriuria: Secondary | ICD-10-CM

## 2022-02-03 DIAGNOSIS — O09299 Supervision of pregnancy with other poor reproductive or obstetric history, unspecified trimester: Secondary | ICD-10-CM

## 2022-02-03 LAB — POCT URINALYSIS DIP (DEVICE)
Bilirubin Urine: NEGATIVE
Glucose, UA: NEGATIVE mg/dL
Hgb urine dipstick: NEGATIVE
Ketones, ur: NEGATIVE mg/dL
Nitrite: NEGATIVE
Protein, ur: NEGATIVE mg/dL
Specific Gravity, Urine: 1.025 (ref 1.005–1.030)
Urobilinogen, UA: 0.2 mg/dL (ref 0.0–1.0)
pH: 7 (ref 5.0–8.0)

## 2022-02-03 NOTE — Patient Instructions (Signed)
Medicinas para estrenimiento: Miralax Dulcolax  Solamente uno a la vez

## 2022-02-03 NOTE — Procedures (Signed)
Diane Huber [birth year] [redacted]w[redacted]d  Fetus A Non-Stress Test Interpretation for 02/03/22  Indication: Chronic Hypertenstion  Fetal Heart Rate A Mode: External Baseline Rate (A): 140 bpm Variability: Moderate Accelerations: 15 x 15 Decelerations: Variable Multiple birth?: No  Uterine Activity Mode: Palpation, Toco Contraction Frequency (min): none Resting Tone Palpated: Relaxed  Interpretation (Fetal Testing) Nonstress Test Interpretation: Reactive Overall Impression: Reassuring for gestational age Comments: Dr. Parke Poisson reviewed tracing.

## 2022-02-09 ENCOUNTER — Other Ambulatory Visit: Payer: Self-pay

## 2022-02-09 ENCOUNTER — Inpatient Hospital Stay (HOSPITAL_COMMUNITY): Payer: Medicaid Other | Admitting: Anesthesiology

## 2022-02-09 ENCOUNTER — Inpatient Hospital Stay (HOSPITAL_COMMUNITY)
Admission: AD | Admit: 2022-02-09 | Discharge: 2022-02-12 | DRG: 788 | Disposition: A | Discharge status: Home / Self Care | Payer: Medicaid Other | Attending: Obstetrics and Gynecology | Admitting: Obstetrics and Gynecology

## 2022-02-09 ENCOUNTER — Encounter (HOSPITAL_COMMUNITY)
Admission: AD | Disposition: A | Discharge status: Home / Self Care | Payer: Self-pay | Source: Home / Self Care | Attending: Obstetrics and Gynecology

## 2022-02-09 ENCOUNTER — Encounter (HOSPITAL_COMMUNITY): Payer: Self-pay | Admitting: Obstetrics and Gynecology

## 2022-02-09 ENCOUNTER — Inpatient Hospital Stay (HOSPITAL_COMMUNITY): Payer: Medicaid Other

## 2022-02-09 DIAGNOSIS — O34211 Maternal care for low transverse scar from previous cesarean delivery: Secondary | ICD-10-CM | POA: Diagnosis present

## 2022-02-09 DIAGNOSIS — Z833 Family history of diabetes mellitus: Secondary | ICD-10-CM | POA: Diagnosis not present

## 2022-02-09 DIAGNOSIS — D509 Iron deficiency anemia, unspecified: Secondary | ICD-10-CM | POA: Diagnosis present

## 2022-02-09 DIAGNOSIS — O9902 Anemia complicating childbirth: Secondary | ICD-10-CM | POA: Diagnosis present

## 2022-02-09 DIAGNOSIS — O1420 HELLP syndrome (HELLP), unspecified trimester: Principal | ICD-10-CM | POA: Diagnosis present

## 2022-02-09 DIAGNOSIS — Z79899 Other long term (current) drug therapy: Secondary | ICD-10-CM

## 2022-02-09 DIAGNOSIS — O1424 HELLP syndrome, complicating childbirth: Secondary | ICD-10-CM

## 2022-02-09 DIAGNOSIS — O34219 Maternal care for unspecified type scar from previous cesarean delivery: Secondary | ICD-10-CM

## 2022-02-09 DIAGNOSIS — O99824 Streptococcus B carrier state complicating childbirth: Secondary | ICD-10-CM | POA: Diagnosis present

## 2022-02-09 DIAGNOSIS — Z7982 Long term (current) use of aspirin: Secondary | ICD-10-CM

## 2022-02-09 DIAGNOSIS — Z3A35 35 weeks gestation of pregnancy: Secondary | ICD-10-CM

## 2022-02-09 DIAGNOSIS — O1423 HELLP syndrome (HELLP), third trimester: Secondary | ICD-10-CM | POA: Diagnosis present

## 2022-02-09 HISTORY — DX: HELLP syndrome (HELLP), unspecified trimester: O14.20

## 2022-02-09 LAB — PROTIME-INR
INR: 1 (ref 0.8–1.2)
Prothrombin Time: 12.9 seconds (ref 11.4–15.2)

## 2022-02-09 LAB — URINALYSIS, ROUTINE W REFLEX MICROSCOPIC
Bilirubin Urine: NEGATIVE
Glucose, UA: NEGATIVE mg/dL
Hgb urine dipstick: NEGATIVE
Ketones, ur: NEGATIVE mg/dL
Leukocytes,Ua: NEGATIVE
Nitrite: NEGATIVE
Protein, ur: NEGATIVE mg/dL
Specific Gravity, Urine: 1.018 (ref 1.005–1.030)
pH: 6 (ref 5.0–8.0)

## 2022-02-09 LAB — CBC
HCT: 38.2 % (ref 36.0–46.0)
HCT: 30 % — ABNORMAL LOW (ref 36.0–46.0)
Hemoglobin: 13.1 g/dL (ref 12.0–15.0)
Hemoglobin: 10.7 g/dL — ABNORMAL LOW (ref 12.0–15.0)
MCH: 31.4 pg (ref 26.0–34.0)
MCH: 32.4 pg (ref 26.0–34.0)
MCHC: 34.3 g/dL (ref 30.0–36.0)
MCHC: 35.7 g/dL (ref 30.0–36.0)
MCV: 91.6 fL (ref 80.0–100.0)
MCV: 90.9 fL (ref 80.0–100.0)
Platelets: 145 10*3/uL — ABNORMAL LOW (ref 150–400)
Platelets: 111 10*3/uL — ABNORMAL LOW (ref 150–400)
RBC: 4.17 MIL/uL (ref 3.87–5.11)
RBC: 3.3 MIL/uL — ABNORMAL LOW (ref 3.87–5.11)
RDW: 14.8 % (ref 11.5–15.5)
RDW: 14.8 % (ref 11.5–15.5)
WBC: 11.3 10*3/uL — ABNORMAL HIGH (ref 4.0–10.5)
WBC: 10.7 10*3/uL — ABNORMAL HIGH (ref 4.0–10.5)
nRBC: 0 % (ref 0.0–0.2)
nRBC: 0 % (ref 0.0–0.2)

## 2022-02-09 LAB — RPR: RPR Ser Ql: NONREACTIVE

## 2022-02-09 LAB — COMPREHENSIVE METABOLIC PANEL
ALT: 124 U/L — ABNORMAL HIGH (ref 0–44)
ALT: 156 U/L — ABNORMAL HIGH (ref 0–44)
AST: 120 U/L — ABNORMAL HIGH (ref 15–41)
AST: 111 U/L — ABNORMAL HIGH (ref 15–41)
Albumin: 2.9 g/dL — ABNORMAL LOW (ref 3.5–5.0)
Albumin: 2.6 g/dL — ABNORMAL LOW (ref 3.5–5.0)
Alkaline Phosphatase: 115 U/L (ref 38–126)
Alkaline Phosphatase: 98 U/L (ref 38–126)
Anion gap: 7 (ref 5–15)
Anion gap: 6 (ref 5–15)
BUN: 12 mg/dL (ref 6–20)
BUN: 12 mg/dL (ref 6–20)
CO2: 19 mmol/L — ABNORMAL LOW (ref 22–32)
CO2: 20 mmol/L — ABNORMAL LOW (ref 22–32)
Calcium: 9.1 mg/dL (ref 8.9–10.3)
Calcium: 7.2 mg/dL — ABNORMAL LOW (ref 8.9–10.3)
Chloride: 110 mmol/L (ref 98–111)
Chloride: 104 mmol/L (ref 98–111)
Creatinine, Ser: 0.48 mg/dL (ref 0.44–1.00)
Creatinine, Ser: 0.53 mg/dL (ref 0.44–1.00)
GFR, Estimated: >60 mL/min (ref >60)
GFR, Estimated: >60 mL/min (ref >60)
Glucose, Bld: 84 mg/dL (ref 70–99)
Glucose, Bld: 131 mg/dL — ABNORMAL HIGH (ref 70–99)
Potassium: 4.2 mmol/L (ref 3.5–5.1)
Potassium: 4.5 mmol/L (ref 3.5–5.1)
Sodium: 136 mmol/L (ref 135–145)
Sodium: 130 mmol/L — ABNORMAL LOW (ref 135–145)
Total Bilirubin: 0.7 mg/dL (ref 0.3–1.2)
Total Bilirubin: 0.7 mg/dL (ref 0.3–1.2)
Total Protein: 6.3 g/dL — ABNORMAL LOW (ref 6.5–8.1)
Total Protein: 5.5 g/dL — ABNORMAL LOW (ref 6.5–8.1)

## 2022-02-09 LAB — TYPE AND SCREEN
ABO/RH(D): O POS
Antibody Screen: NEGATIVE

## 2022-02-09 LAB — PROTEIN / CREATININE RATIO, URINE
Creatinine, Urine: 140.08 mg/dL
Protein Creatinine Ratio: 0.22 mg/mg{Cre} — ABNORMAL HIGH (ref 0.00–0.15)
Total Protein, Urine: 31 mg/dL

## 2022-02-09 LAB — APTT: aPTT: 26 seconds (ref 24–36)

## 2022-02-09 LAB — FIBRINOGEN: Fibrinogen: 486 mg/dL — ABNORMAL HIGH (ref 210–475)

## 2022-02-09 IMAGING — US US ABDOMEN LIMITED
1 series · 15 of 25 positions shown · non-contrast
Comparison: None Available.

CLINICAL DATA: Right upper quadrant pain

EXAM:
ULTRASOUND ABDOMEN LIMITED RIGHT UPPER QUADRANT

[Series 1: us abdomen limited · 15 of 67 slices shown]
[im 1/67]
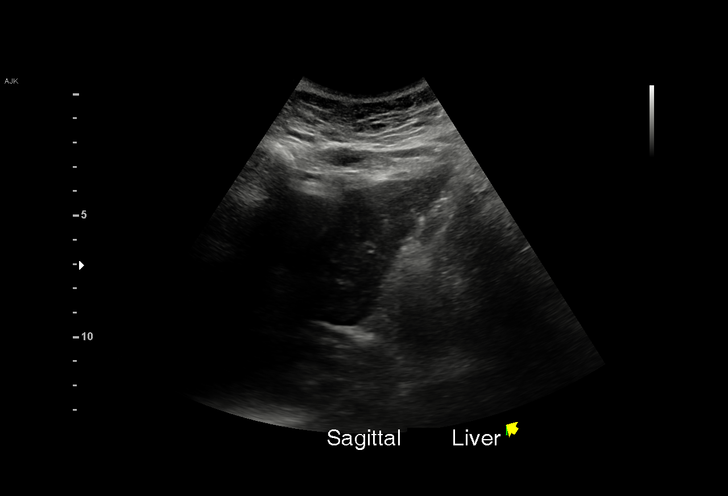
[im 6/67]
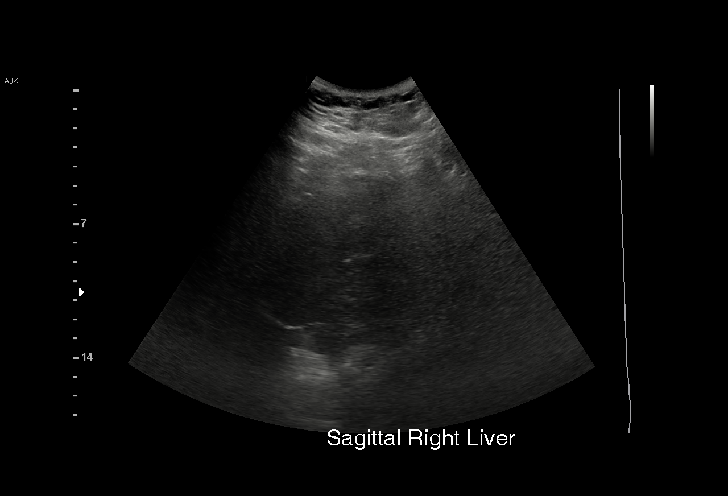
[im 12/67]
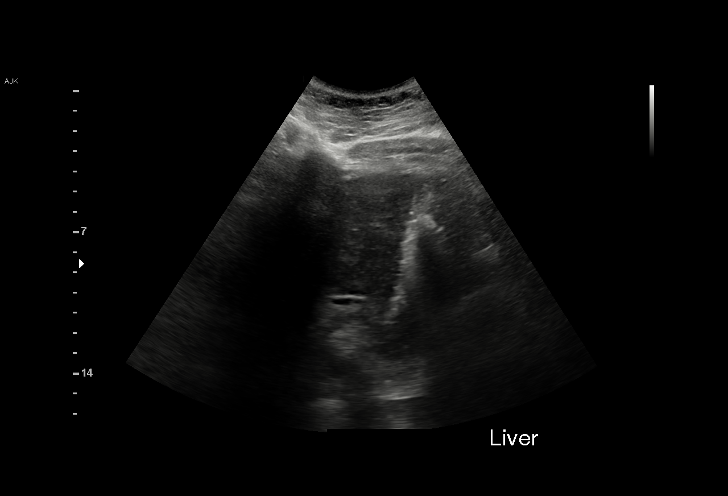
[im 14/67]
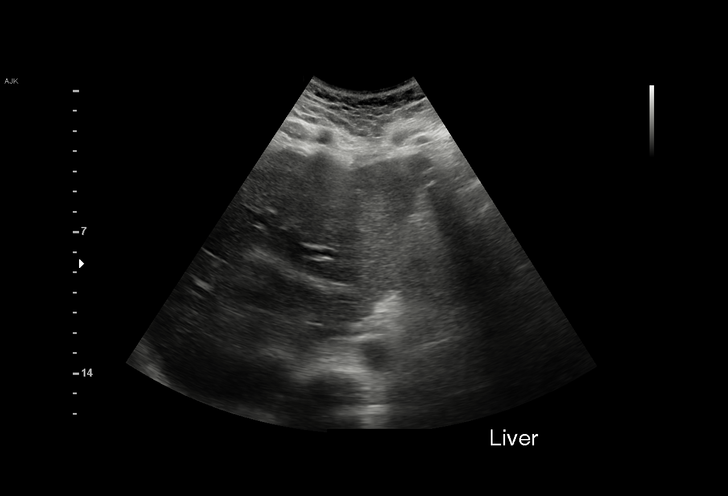
[im 20/67]
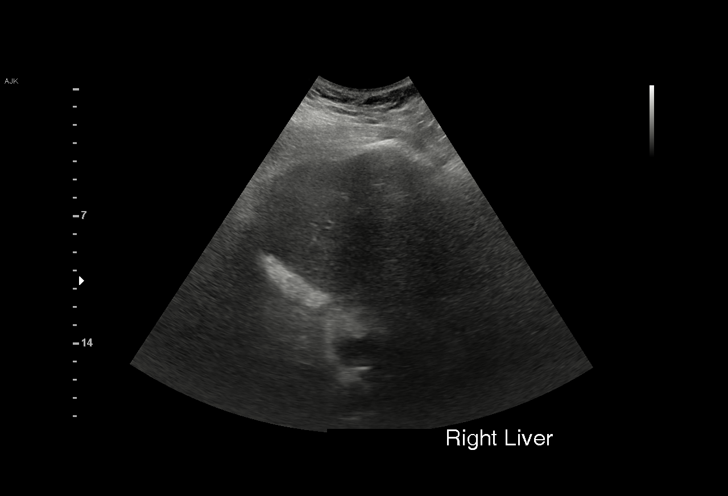
[im 25/67]
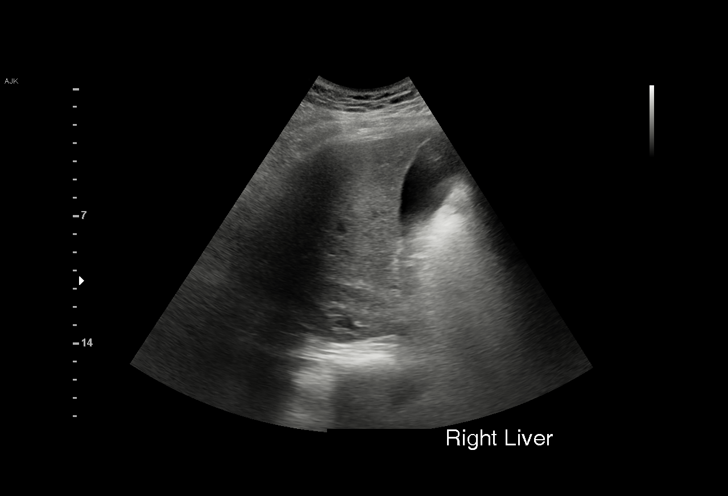
[im 28/67]
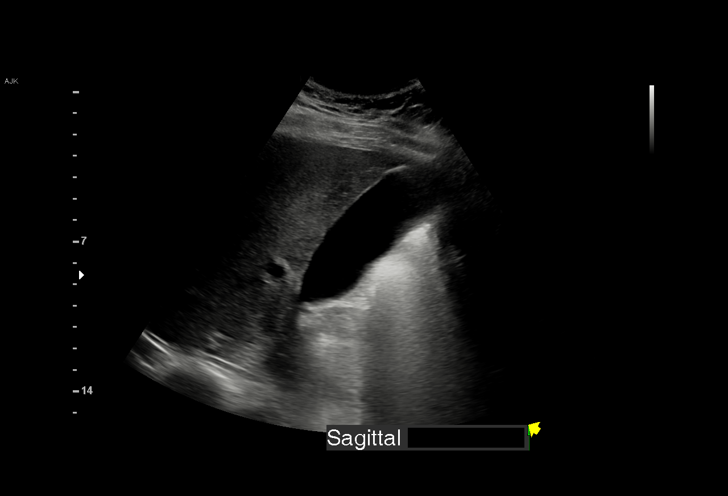
[im 34/67]
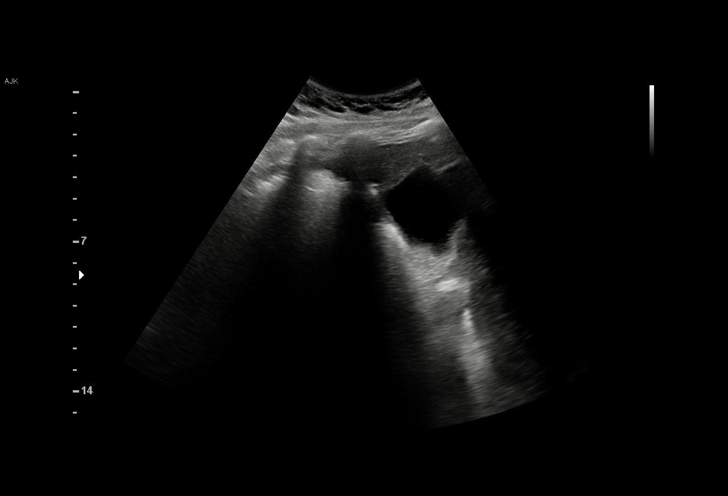
[im 39/67]
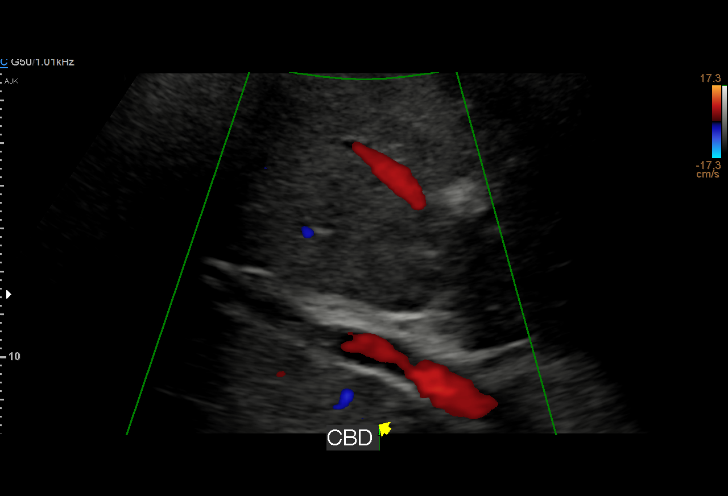
[im 42/67]
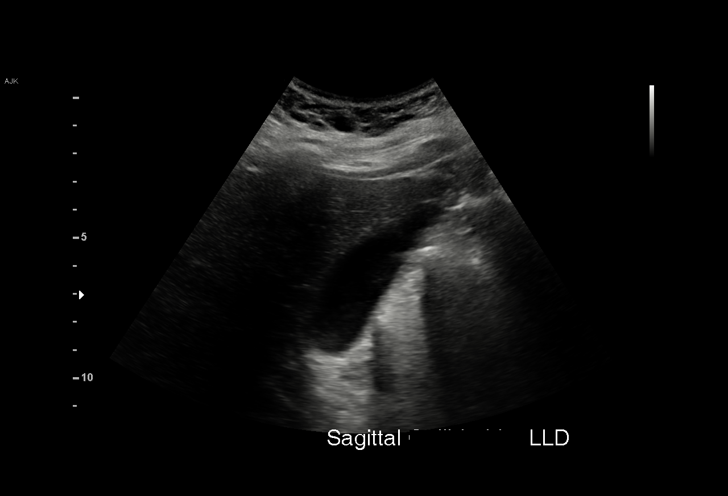
[im 47/67]
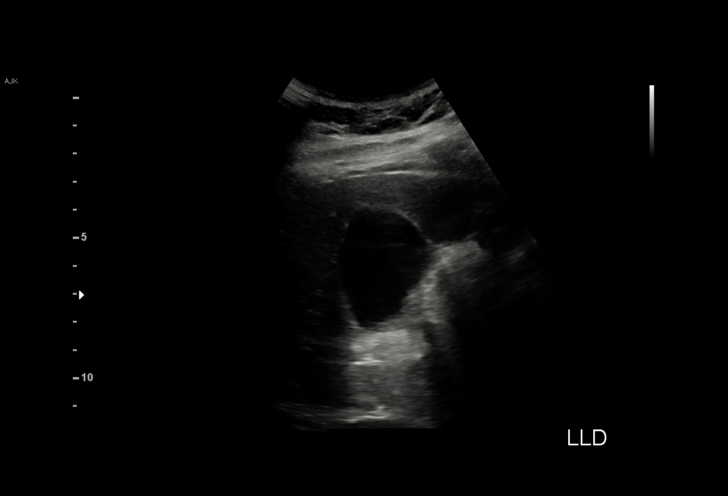
[im 53/67]
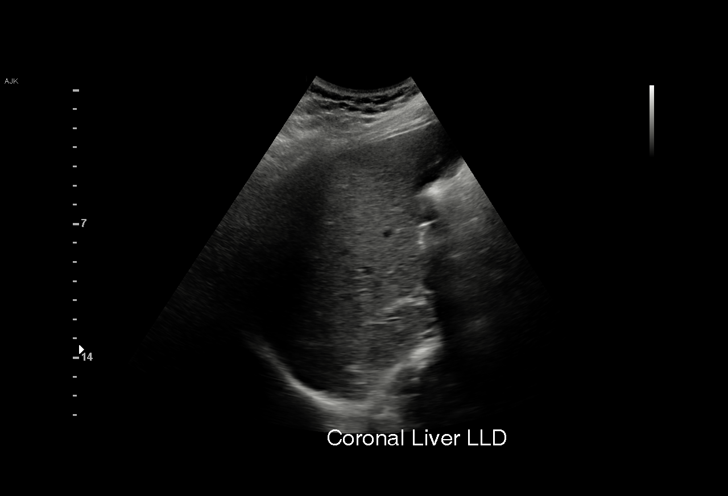
[im 56/67]
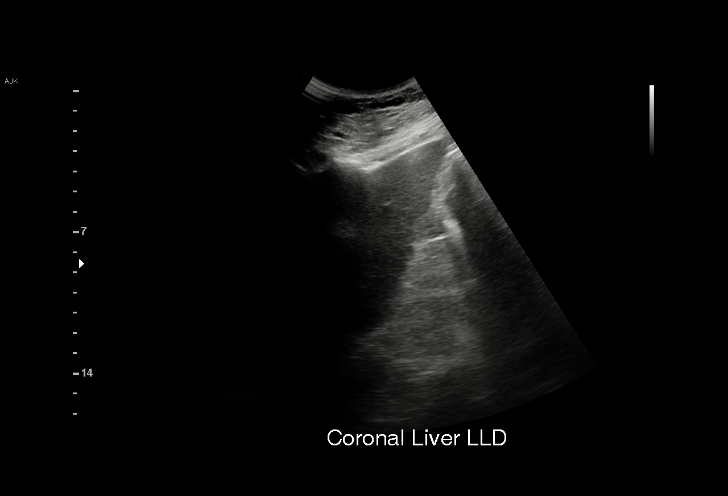
[im 61/67]
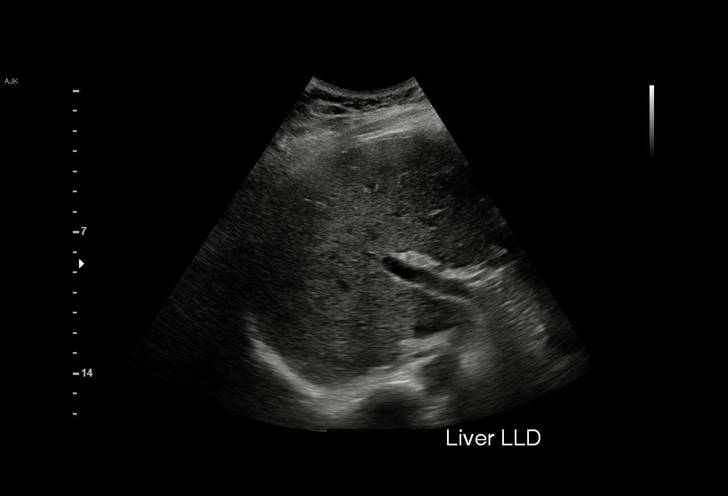
[im 67/67]
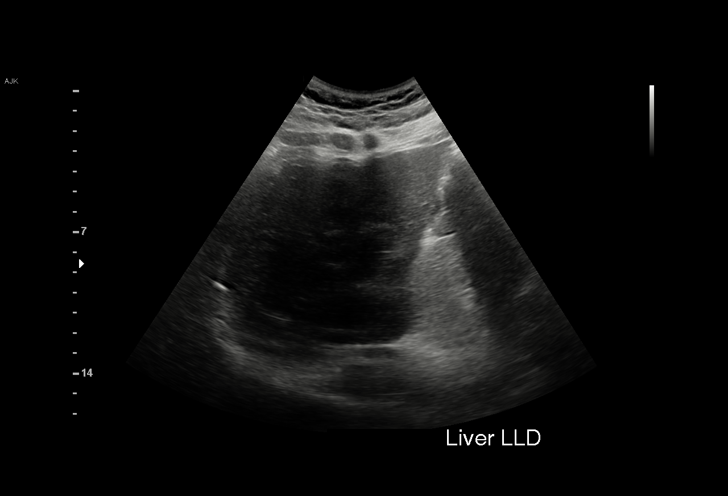

[15 of 25 positions shown; findings below may reference images not displayed]

FINDINGS: Gallbladder:

No gallstones or wall thickening visualized. No sonographic Murphy
sign noted by sonographer.

Common bile duct:

Diameter: 2 mm

Liver:

No focal lesion identified. Within normal limits in parenchymal
echogenicity. Portal vein is patent on color Doppler imaging with
normal direction of blood flow towards the liver.
IMPRESSION: Negative right upper quadrant ultrasound.

## 2022-02-09 SURGERY — Surgical Case
Anesthesia: Spinal

## 2022-02-09 MED ORDER — ONDANSETRON HCL 4 MG/2ML IJ SOLN
4.0000 mg | Freq: Three times a day (TID) | INTRAMUSCULAR | Status: DC | PRN
  Administered 2022-02-09: 4 mg via INTRAVENOUS
  Filled 2022-02-09: qty 2

## 2022-02-09 MED ORDER — HYDRALAZINE HCL 20 MG/ML IJ SOLN
10.0000 mg | INTRAMUSCULAR | Status: DC | PRN
  Administered 2022-02-09: 10 mg via INTRAVENOUS
  Filled 2022-02-09: qty 1

## 2022-02-09 MED ORDER — LABETALOL HCL 5 MG/ML IV SOLN: 80.0000 mg | INTRAVENOUS | Status: DC | PRN

## 2022-02-09 MED ORDER — HYDRALAZINE HCL 20 MG/ML IJ SOLN: 10.0000 mg | INTRAMUSCULAR | Status: DC | PRN

## 2022-02-09 MED ORDER — LABETALOL HCL 5 MG/ML IV SOLN
40.0000 mg | INTRAVENOUS | Status: DC | PRN
  Administered 2022-02-09: 40 mg via INTRAVENOUS
  Filled 2022-02-09 (×2): qty 8

## 2022-02-09 MED ORDER — CEFAZOLIN SODIUM-DEXTROSE 2-4 GM/100ML-% IV SOLN
2.0000 g | INTRAVENOUS | Status: AC
  Administered 2022-02-09: 2 g via INTRAVENOUS

## 2022-02-09 MED ORDER — DEXAMETHASONE SODIUM PHOSPHATE 4 MG/ML IJ SOLN
INTRAMUSCULAR | Status: AC
  Filled 2022-02-09: qty 1

## 2022-02-09 MED ORDER — GABAPENTIN 100 MG PO CAPS
200.0000 mg | ORAL_CAPSULE | Freq: Two times a day (BID) | ORAL | Status: DC
  Administered 2022-02-09 – 2022-02-12 (×7): 200 mg via ORAL
  Filled 2022-02-09 (×7): qty 2

## 2022-02-09 MED ORDER — PHENYLEPHRINE HCL-NACL 20-0.9 MG/250ML-% IV SOLN
INTRAVENOUS | Status: DC | PRN
  Administered 2022-02-09: 60 ug/min via INTRAVENOUS

## 2022-02-09 MED ORDER — WITCH HAZEL-GLYCERIN EX PADS: 1.0000 "application " | MEDICATED_PAD | CUTANEOUS | Status: DC | PRN

## 2022-02-09 MED ORDER — LACTATED RINGERS IV SOLN: INTRAVENOUS | Status: DC

## 2022-02-09 MED ORDER — MENTHOL 3 MG MT LOZG: 1.0000 | LOZENGE | OROMUCOSAL | Status: DC | PRN

## 2022-02-09 MED ORDER — KETOROLAC TROMETHAMINE 30 MG/ML IJ SOLN: 30.0000 mg | Freq: Four times a day (QID) | INTRAMUSCULAR | Status: AC | PRN

## 2022-02-09 MED ORDER — SODIUM CHLORIDE 0.9 % IV SOLN: 5.0000 10*6.[IU] | Freq: Once | INTRAVENOUS | Status: DC

## 2022-02-09 MED ORDER — NALOXONE HCL 4 MG/10ML IJ SOLN: 1.0000 ug/kg/h | INTRAVENOUS | Status: DC | PRN

## 2022-02-09 MED ORDER — SCOPOLAMINE 1 MG/3DAYS TD PT72
1.0000 | MEDICATED_PATCH | Freq: Once | TRANSDERMAL | Status: AC
  Administered 2022-02-09: 1.5 mg via TRANSDERMAL

## 2022-02-09 MED ORDER — EPHEDRINE 5 MG/ML INJ
INTRAVENOUS | Status: AC
  Filled 2022-02-09: qty 5

## 2022-02-09 MED ORDER — OXYTOCIN-SODIUM CHLORIDE 30-0.9 UT/500ML-% IV SOLN
INTRAVENOUS | Status: AC
  Filled 2022-02-09: qty 500

## 2022-02-09 MED ORDER — LIDOCAINE HCL (PF) 1 % IJ SOLN: 30.0000 mL | INTRAMUSCULAR | Status: DC | PRN

## 2022-02-09 MED ORDER — TETANUS-DIPHTH-ACELL PERTUSSIS 5-2.5-18.5 LF-MCG/0.5 IM SUSY: 0.5000 mL | PREFILLED_SYRINGE | Freq: Once | INTRAMUSCULAR | Status: DC

## 2022-02-09 MED ORDER — OXYTOCIN BOLUS FROM INFUSION: 333.0000 mL | Freq: Once | INTRAVENOUS | Status: DC

## 2022-02-09 MED ORDER — COCONUT OIL OIL: 1.0000 "application " | TOPICAL_OIL | Status: DC | PRN

## 2022-02-09 MED ORDER — SOD CITRATE-CITRIC ACID 500-334 MG/5ML PO SOLN: 30.0000 mL | ORAL | Status: DC | PRN

## 2022-02-09 MED ORDER — TRANEXAMIC ACID-NACL 1000-0.7 MG/100ML-% IV SOLN
1000.0000 mg | Freq: Once | INTRAVENOUS | Status: AC
  Administered 2022-02-09: 1000 mg via INTRAVENOUS

## 2022-02-09 MED ORDER — DEXAMETHASONE SODIUM PHOSPHATE 4 MG/ML IJ SOLN
INTRAMUSCULAR | Status: DC | PRN
  Administered 2022-02-09: 4 mg via INTRAVENOUS

## 2022-02-09 MED ORDER — MEPERIDINE HCL 25 MG/ML IJ SOLN: 6.2500 mg | INTRAMUSCULAR | Status: DC | PRN

## 2022-02-09 MED ORDER — BUPIVACAINE IN DEXTROSE 0.75-8.25 % IT SOLN
INTRATHECAL | Status: DC | PRN
Start: 2022-02-09 — End: 2022-02-09
  Administered 2022-02-09: 1.4 mL via INTRATHECAL

## 2022-02-09 MED ORDER — FENTANYL CITRATE (PF) 100 MCG/2ML IJ SOLN
50.0000 ug | INTRAMUSCULAR | Status: DC | PRN
  Administered 2022-02-09: 15 ug via INTRAVENOUS

## 2022-02-09 MED ORDER — MAGNESIUM SULFATE BOLUS VIA INFUSION
6.0000 g | Freq: Once | INTRAVENOUS | Status: AC
  Administered 2022-02-09: 6 g via INTRAVENOUS
  Filled 2022-02-09: qty 1000

## 2022-02-09 MED ORDER — DIPHENHYDRAMINE HCL 50 MG/ML IJ SOLN: 12.5000 mg | INTRAMUSCULAR | Status: DC | PRN

## 2022-02-09 MED ORDER — LACTATED RINGERS IV SOLN: 500.0000 mL | INTRAVENOUS | Status: DC | PRN

## 2022-02-09 MED ORDER — MAGNESIUM SULFATE 40 GM/1000ML IV SOLN
2.0000 g/h | INTRAVENOUS | Status: AC
  Administered 2022-02-09 (×2): 2 g/h via INTRAVENOUS
  Filled 2022-02-09: qty 1000

## 2022-02-09 MED ORDER — MORPHINE SULFATE (PF) 0.5 MG/ML IJ SOLN
INTRAMUSCULAR | Status: AC
  Filled 2022-02-09: qty 10

## 2022-02-09 MED ORDER — SCOPOLAMINE 1 MG/3DAYS TD PT72
MEDICATED_PATCH | TRANSDERMAL | Status: AC
  Filled 2022-02-09: qty 1

## 2022-02-09 MED ORDER — FENTANYL CITRATE (PF) 100 MCG/2ML IJ SOLN
INTRAMUSCULAR | Status: AC
  Filled 2022-02-09: qty 2

## 2022-02-09 MED ORDER — PRENATAL MULTIVITAMIN CH
1.0000 | ORAL_TABLET | Freq: Every day | ORAL | Status: DC
  Administered 2022-02-09 – 2022-02-11 (×3): 1 via ORAL
  Filled 2022-02-09 (×3): qty 1

## 2022-02-09 MED ORDER — LABETALOL HCL 5 MG/ML IV SOLN: 20.0000 mg | INTRAVENOUS | Status: DC | PRN

## 2022-02-09 MED ORDER — DIPHENHYDRAMINE HCL 25 MG PO CAPS: 25.0000 mg | ORAL_CAPSULE | ORAL | Status: DC | PRN

## 2022-02-09 MED ORDER — LABETALOL HCL 5 MG/ML IV SOLN
20.0000 mg | INTRAVENOUS | Status: DC | PRN
Start: 2022-02-09 — End: 2022-02-09
  Administered 2022-02-09: 20 mg via INTRAVENOUS
  Filled 2022-02-09: qty 4

## 2022-02-09 MED ORDER — LIDOCAINE VISCOUS HCL 2 % MT SOLN: 15.0000 mL | Freq: Once | OROMUCOSAL | Status: DC

## 2022-02-09 MED ORDER — OXYTOCIN-SODIUM CHLORIDE 30-0.9 UT/500ML-% IV SOLN
INTRAVENOUS | Status: DC | PRN
Start: 2022-02-09 — End: 2022-02-09
  Administered 2022-02-09: 200 mL via INTRAVENOUS

## 2022-02-09 MED ORDER — OXYTOCIN-SODIUM CHLORIDE 30-0.9 UT/500ML-% IV SOLN: 2.5000 [IU]/h | INTRAVENOUS | Status: AC

## 2022-02-09 MED ORDER — DIPHENHYDRAMINE HCL 25 MG PO CAPS: 25.0000 mg | ORAL_CAPSULE | Freq: Four times a day (QID) | ORAL | Status: DC | PRN

## 2022-02-09 MED ORDER — OXYTOCIN-SODIUM CHLORIDE 30-0.9 UT/500ML-% IV SOLN: 2.5000 [IU]/h | INTRAVENOUS | Status: DC

## 2022-02-09 MED ORDER — PENICILLIN G POT IN DEXTROSE 60000 UNIT/ML IV SOLN: 3.0000 10*6.[IU] | INTRAVENOUS | Status: DC

## 2022-02-09 MED ORDER — FENTANYL CITRATE (PF) 100 MCG/2ML IJ SOLN: 25.0000 ug | INTRAMUSCULAR | Status: DC | PRN

## 2022-02-09 MED ORDER — ENOXAPARIN SODIUM 40 MG/0.4ML IJ SOSY
40.0000 mg | PREFILLED_SYRINGE | INTRAMUSCULAR | Status: DC
  Administered 2022-02-09 – 2022-02-11 (×3): 40 mg via SUBCUTANEOUS
  Filled 2022-02-09 (×3): qty 0.4

## 2022-02-09 MED ORDER — ONDANSETRON HCL 4 MG/2ML IJ SOLN
4.0000 mg | Freq: Four times a day (QID) | INTRAMUSCULAR | Status: DC | PRN
  Administered 2022-02-09: 4 mg via INTRAVENOUS

## 2022-02-09 MED ORDER — IBUPROFEN 600 MG PO TABS
600.0000 mg | ORAL_TABLET | Freq: Four times a day (QID) | ORAL | Status: AC
  Administered 2022-02-09 – 2022-02-12 (×12): 600 mg via ORAL
  Filled 2022-02-09 (×12): qty 1

## 2022-02-09 MED ORDER — ONDANSETRON HCL 4 MG/2ML IJ SOLN
INTRAMUSCULAR | Status: AC
  Filled 2022-02-09: qty 2

## 2022-02-09 MED ORDER — PHENYLEPHRINE HCL (PRESSORS) 10 MG/ML IV SOLN
INTRAVENOUS | Status: DC | PRN
  Administered 2022-02-09: 80 ug via INTRAVENOUS

## 2022-02-09 MED ORDER — LABETALOL HCL 100 MG PO TABS: 300.0000 mg | ORAL_TABLET | Freq: Two times a day (BID) | ORAL | Status: DC

## 2022-02-09 MED ORDER — ACETAMINOPHEN 500 MG PO TABS
1000.0000 mg | ORAL_TABLET | Freq: Four times a day (QID) | ORAL | Status: AC
  Administered 2022-02-09 – 2022-02-10 (×2): 1000 mg via ORAL
  Filled 2022-02-09 (×3): qty 2

## 2022-02-09 MED ORDER — NALOXONE HCL 0.4 MG/ML IJ SOLN: 0.4000 mg | INTRAMUSCULAR | Status: DC | PRN

## 2022-02-09 MED ORDER — SENNOSIDES-DOCUSATE SODIUM 8.6-50 MG PO TABS: 2.0000 | ORAL_TABLET | Freq: Every evening | ORAL | Status: DC | PRN

## 2022-02-09 MED ORDER — LABETALOL HCL 5 MG/ML IV SOLN
40.0000 mg | INTRAVENOUS | Status: DC | PRN
  Administered 2022-02-09: 40 mg via INTRAVENOUS

## 2022-02-09 MED ORDER — MAGNESIUM SULFATE 40 GM/1000ML IV SOLN
2.0000 g/h | INTRAVENOUS | Status: DC
  Administered 2022-02-09: 2 g/h via INTRAVENOUS
  Filled 2022-02-09: qty 1000

## 2022-02-09 MED ORDER — SODIUM CHLORIDE 0.9 % IV SOLN: INTRAVENOUS | Status: DC

## 2022-02-09 MED ORDER — SODIUM CHLORIDE 0.9% FLUSH: 3.0000 mL | INTRAVENOUS | Status: DC | PRN

## 2022-02-09 MED ORDER — ALUM & MAG HYDROXIDE-SIMETH 200-200-20 MG/5ML PO SUSP: 30.0000 mL | Freq: Once | ORAL | Status: DC

## 2022-02-09 MED ORDER — ACETAMINOPHEN 325 MG PO TABS: 650.0000 mg | ORAL_TABLET | ORAL | Status: DC | PRN

## 2022-02-09 MED ORDER — TRANEXAMIC ACID-NACL 1000-0.7 MG/100ML-% IV SOLN
INTRAVENOUS | Status: AC
  Filled 2022-02-09: qty 100

## 2022-02-09 MED ORDER — MORPHINE SULFATE (PF) 0.5 MG/ML IJ SOLN
INTRAMUSCULAR | Status: DC | PRN
  Administered 2022-02-09: 150 ug via INTRATHECAL

## 2022-02-09 MED ORDER — HYDROMORPHONE HCL 1 MG/ML IJ SOLN
1.0000 mg | INTRAMUSCULAR | Status: DC | PRN
  Administered 2022-02-09: 1 mg via INTRAVENOUS
  Filled 2022-02-09: qty 1

## 2022-02-09 MED ORDER — POLYETHYLENE GLYCOL 3350 17 G PO PACK
17.0000 g | PACK | Freq: Every day | ORAL | Status: DC
  Administered 2022-02-10 – 2022-02-12 (×3): 17 g via ORAL
  Filled 2022-02-09 (×3): qty 1

## 2022-02-09 MED ORDER — DIBUCAINE (PERIANAL) 1 % EX OINT: 1.0000 "application " | TOPICAL_OINTMENT | CUTANEOUS | Status: DC | PRN

## 2022-02-09 MED ORDER — EPHEDRINE SULFATE (PRESSORS) 50 MG/ML IJ SOLN
INTRAMUSCULAR | Status: DC | PRN
  Administered 2022-02-09: 5 mg via INTRAVENOUS

## 2022-02-09 MED ORDER — PHENYLEPHRINE 80 MCG/ML (10ML) SYRINGE FOR IV PUSH (FOR BLOOD PRESSURE SUPPORT)
PREFILLED_SYRINGE | INTRAVENOUS | Status: AC
  Filled 2022-02-09: qty 10

## 2022-02-09 MED ORDER — SIMETHICONE 80 MG PO CHEW
80.0000 mg | CHEWABLE_TABLET | Freq: Three times a day (TID) | ORAL | Status: DC
  Administered 2022-02-09 – 2022-02-12 (×9): 80 mg via ORAL
  Filled 2022-02-09 (×9): qty 1

## 2022-02-09 MED ORDER — CEFAZOLIN SODIUM-DEXTROSE 2-4 GM/100ML-% IV SOLN
INTRAVENOUS | Status: AC
  Filled 2022-02-09: qty 100

## 2022-02-09 SURGICAL SUPPLY — 36 items
APL SKNCLS STERI-STRIP NONHPOA (GAUZE/BANDAGES/DRESSINGS) ×1
BENZOIN TINCTURE PRP APPL 2/3 (GAUZE/BANDAGES/DRESSINGS) ×2 IMPLANT
CANISTER SUCT 3000ML PPV (MISCELLANEOUS) ×2 IMPLANT
CHLORAPREP W/TINT 26ML (MISCELLANEOUS) ×4 IMPLANT
CLAMP UMBILICAL CORD (MISCELLANEOUS) ×2 IMPLANT
DRSG OPSITE POSTOP 4X10 (GAUZE/BANDAGES/DRESSINGS) ×2 IMPLANT
DRSG OPSITE POSTOP 4X8 (GAUZE/BANDAGES/DRESSINGS) ×1 IMPLANT
ELECT REM PT RETURN 9FT ADLT (ELECTROSURGICAL) ×2
ELECTRODE REM PT RTRN 9FT ADLT (ELECTROSURGICAL) ×1 IMPLANT
EXTRACTOR VACUUM KIWI (MISCELLANEOUS) ×2 IMPLANT
GAUZE SPONGE 4X4 12PLY STRL LF (GAUZE/BANDAGES/DRESSINGS) ×2 IMPLANT
GLOVE BIOGEL PI IND STRL 7.0 (GLOVE) ×2 IMPLANT
GLOVE BIOGEL PI IND STRL 7.5 (GLOVE) ×1 IMPLANT
GLOVE BIOGEL PI INDICATOR 7.0 (GLOVE) ×4
GLOVE BIOGEL PI INDICATOR 7.5 (GLOVE) ×2
GLOVE SKINSENSE NS SZ7.0 (GLOVE) ×2
GLOVE SKINSENSE STRL SZ7.0 (GLOVE) ×1 IMPLANT
GOWN STRL REUS W/ TWL LRG LVL3 (GOWN DISPOSABLE) ×2 IMPLANT
GOWN STRL REUS W/ TWL XL LVL3 (GOWN DISPOSABLE) ×1 IMPLANT
GOWN STRL REUS W/TWL LRG LVL3 (GOWN DISPOSABLE) ×4
GOWN STRL REUS W/TWL XL LVL3 (GOWN DISPOSABLE) ×2
NS IRRIG 1000ML POUR BTL (IV SOLUTION) ×2 IMPLANT
PACK C SECTION WH (CUSTOM PROCEDURE TRAY) ×2 IMPLANT
PAD ABD 7.5X8 STRL (GAUZE/BANDAGES/DRESSINGS) ×2 IMPLANT
PAD OB MATERNITY 4.3X12.25 (PERSONAL CARE ITEMS) ×2 IMPLANT
PAD PREP 24X48 CUFFED NSTRL (MISCELLANEOUS) ×2 IMPLANT
STRIP CLOSURE SKIN 1/2X4 (GAUZE/BANDAGES/DRESSINGS) ×2 IMPLANT
SUT MNCRL 0 VIOLET CTX 36 (SUTURE) ×2 IMPLANT
SUT MON AB 4-0 PS1 27 (SUTURE) ×2 IMPLANT
SUT MONOCRYL 0 CTX 36 (SUTURE) ×4
SUT PLAIN 2 0 XLH (SUTURE) ×2 IMPLANT
SUT VIC AB 0 CT1 36 (SUTURE) ×4 IMPLANT
SUT VIC AB 3-0 CT1 27 (SUTURE) ×2
SUT VIC AB 3-0 CT1 TAPERPNT 27 (SUTURE) ×1 IMPLANT
TOWEL OR 17X24 6PK STRL BLUE (TOWEL DISPOSABLE) ×4 IMPLANT
WATER STERILE IRR 1000ML POUR (IV SOLUTION) ×2 IMPLANT

## 2022-02-09 NOTE — Anesthesia Postprocedure Evaluation (Signed)
Anesthesia Post Note  Patient: Diane Huber  Procedure(s) Performed: Pablo Pena     Patient location during evaluation: PACU Anesthesia Type: Spinal Level of consciousness: awake and alert and oriented Pain management: pain level controlled Vital Signs Assessment: post-procedure vital signs reviewed and stable Respiratory status: spontaneous breathing, nonlabored ventilation and respiratory function stable Cardiovascular status: blood pressure returned to baseline and stable Postop Assessment: no headache, no backache, spinal receding and no apparent nausea or vomiting Anesthetic complications: no   No notable events documented.  Last Vitals:  Vitals:   02/09/22 0751 02/09/22 0800  BP: 106/69 102/64  Pulse: 68 68  Resp: 13 13  Temp: (!) 35.9 C   SpO2: 99% 100%    Last Pain:  Vitals:   02/09/22 0751  TempSrc: Axillary  PainSc: 0-No pain                 Pervis Hocking

## 2022-02-09 NOTE — MAU Note (Signed)
..  Poet Hineman is a 41 y.o. at [redacted]w[redacted]d here in MAU reporting: Epigastric pain that began yesterday and got worse throughout the night, reports she took her bp at home and had a reading of 192/110. Took labetelol at 10:30pm. Denies ctx, vaginal bleeding or leaking of fluid. +FM  Pain score: 8/10 Vitals:   02/09/22 0301 02/09/22 0305  BP: (!) 165/96   Pulse: 82   Resp: 19   Temp: 97.8 F (36.6 C)   SpO2:  100%     WUG:[id number] in room 140's Lab orders placed from triage: UA

## 2022-02-09 NOTE — Anesthesia Preprocedure Evaluation (Addendum)
Anesthesia Evaluation  Patient identified by MRN, date of birth, ID band Patient awake    Reviewed: Allergy & Precautions, NPO status , Patient's Chart, lab work & pertinent test results  Airway Mallampati: III  TM Distance: >3 FB Neck ROM: Full    Dental   Pulmonary neg pulmonary ROS,    breath sounds clear to auscultation       Cardiovascular hypertension,  Rhythm:Regular Rate:Normal     Neuro/Psych negative neurological ROS     GI/Hepatic negative GI ROS, HELLP syndrome    Endo/Other  negative endocrine ROS  Renal/GU negative Renal ROS     Musculoskeletal   Abdominal   Peds  Hematology negative hematology ROS (+)   Anesthesia Other Findings   Reproductive/Obstetrics (+) Pregnancy                            Lab Results  Component Value Date   WBC 11.3 (H) 02/09/2022   HGB 13.1 02/09/2022   HCT 38.2 02/09/2022   MCV 91.6 02/09/2022   PLT 145 (L) 02/09/2022   Lab Results  Component Value Date   CREATININE 0.48 02/09/2022   BUN 12 02/09/2022   NA 136 02/09/2022   K 4.2 02/09/2022   CL 110 02/09/2022   CO2 19 (L) 02/09/2022    Anesthesia Physical Anesthesia Plan  ASA: 3 and emergent  Anesthesia Plan: Spinal   Post-op Pain Management:    Induction:   PONV Risk Score and Plan: 2 and Dexamethasone, Ondansetron and Treatment may vary due to age or medical condition  Airway Management Planned: Natural Airway  Additional Equipment:   Intra-op Plan:   Post-operative Plan:   Informed Consent: I have reviewed the patients History and Physical, chart, labs and discussed the procedure including the risks, benefits and alternatives for the proposed anesthesia with the patient or authorized representative who has indicated his/her understanding and acceptance.       Plan Discussed with:   Anesthesia Plan Comments:         Anesthesia Quick Evaluation

## 2022-02-09 NOTE — H&P (Signed)
Chief Complaint:  Hypertension and Abdominal Pain   Event Date/Time   First Provider Initiated Contact with Patient 02/09/22 0311    HPI: Diane Huber is a 41 y.o. [id number] at 25w2dwho presents to maternity admissions reporting onset of severe RUQ pain yesterday which worsened tonight. .States that is how she presented in 2013 when she had HELLP syndrome   Chart review showed she presented with RUQ pain, had very elevated Transaminases and low platelets  Delivered by C/S.  She reports good fetal movement, denies LOF, vaginal bleeding, vaginal itching/burning, urinary symptoms, h/a, dizziness, n/v, diarrhea, constipation or fever/chills.  She denies headache, visual changes.  Hypertension This is a chronic problem. The problem has been gradually worsening since onset. Pertinent negatives include no blurred vision or headaches. There are no associated agents to hypertension. Treatments tried: Took Labetalol at 10pm. There are no compliance problems.   Abdominal Pain This is a new problem. The current episode started yesterday. The problem occurs constantly. The problem has been gradually worsening. The pain is located in the RUQ. The abdominal pain does not radiate. Pertinent negatives include no constipation, diarrhea, dysuria, fever or headaches. Nothing aggravates the pain. The pain is relieved by Nothing. She has tried nothing for the symptoms.    RN Note: Diane Huber is a 41 y.o. at [redacted]w[redacted]d here in MAU reporting: Epigastric pain that began yesterday and got worse throughout the night, reports she took her bp at home and had a reading of 192/110. Took labetelol at 10:30pm. Denies ctx, vaginal bleeding or leaking of fluid. +FM   Pain score: 8/10   Past Medical History: Past Medical History:  Diagnosis Date   Essential hypertension 01/20/2021   History of gestational hypertension 07/31/2021    Past obstetric history: OB History  Gravida Para Term Preterm AB Living  5 2 1 1 2 2    SAB IAB Ectopic Multiple Live Births  1 1 0 0 2    # Outcome Date GA Lbr Len/2nd Weight Sex Delivery Anes PTL Lv  5 Current           4 Preterm 11/05/11 [redacted]w[redacted]d  2440 g M CS-LTranv Gen  LIV  3 Term 05/08/03     Vag-Spont   LIV  2 SAB           1 IAB             Past Surgical History: Past Surgical History:  Procedure Laterality Date   CESAREAN SECTION  11/05/2011   Procedure: CESAREAN SECTION;  Surgeon: 11/07/2011, MD;  Location: [facility];  Service: Gynecology;  Laterality: N/A;  Primary cesarean section of baby boy  at 13 APGAR 4/8 cord ph 7.33    Family History: Family History  Problem Relation Age of Onset   Diabetes Paternal Grandmother    Anesthesia problems Neg Hx    Malignant hyperthermia Neg Hx    Hypotension Neg Hx    Pseudochol deficiency Neg Hx     Social History: Social History   Tobacco Use   Smoking status: Never   Smokeless tobacco: Never  Vaping Use   Vaping Use: Never used  Substance Use Topics   Alcohol use: No   Drug use: No    Allergies: No Known Allergies  Meds:  Medications Prior to Admission  Medication Sig Dispense Refill Last Dose   aspirin EC 81 MG tablet Take 1 tablet (81 mg total) by mouth daily. Swallow whole. 100 tablet 3  labetalol (NORMODYNE) 300 MG tablet Take 1 tablet (300 mg total) by mouth 2 (two) times daily. 60 tablet 5    Prenatal Vit-Fe Fumarate-FA (PRENATAL MULTIVITAMIN) TABS tablet Take 1 tablet by mouth daily at 12 noon.       I have reviewed patient's Past Medical Hx, Surgical Hx, Family Hx, Social Hx, medications and allergies.   ROS:  Review of Systems  Constitutional:  Negative for fever.  Eyes:  Negative for blurred vision.  Gastrointestinal:  Positive for abdominal pain. Negative for constipation and diarrhea.  Genitourinary:  Negative for dysuria.  Neurological:  Negative for headaches.  Other systems negative  Physical Exam  Patient Vitals for the past 24 hrs:  BP Temp Temp src Pulse Resp SpO2   02/09/22 0305 -- -- -- -- -- 100 %  02/09/22 0301 (!) 165/96 97.8 F (36.6 C) Oral 82 19 --   Constitutional: Well-developed, well-nourished female in no acute distress.  Cardiovascular: normal rate and rhythm Respiratory: normal effort, clear to auscultation bilaterally GI: Abd soft, very tender over upper abdomen (epigastric and RUQ), gravid appropriate for gestational age.   No rebound or guarding. MS: Extremities nontender, no edema, normal ROM Neurologic: Alert and oriented x 4.  GU: Neg CVAT.  PELVIC EXAM:   Long and closed  FHT:  Baseline 140 , moderate variability, accelerations present, no decelerations Contractions: Occasional    Labs: Results for orders placed or performed during the hospital encounter of 02/09/22 (from the past 24 hour(s))  Urinalysis, Routine w reflex microscopic Urine, Clean Catch     Status: Abnormal   Collection Time: 02/09/22  3:25 AM  Result Value Ref Range   Color, Urine YELLOW YELLOW   APPearance HAZY (A) CLEAR   Specific Gravity, Urine 1.018 1.005 - 1.030   pH 6.0 5.0 - 8.0   Glucose, UA NEGATIVE NEGATIVE mg/dL   Hgb urine dipstick NEGATIVE NEGATIVE   Bilirubin Urine NEGATIVE NEGATIVE   Ketones, ur NEGATIVE NEGATIVE mg/dL   Protein, ur NEGATIVE NEGATIVE mg/dL   Nitrite NEGATIVE NEGATIVE   Leukocytes,Ua NEGATIVE NEGATIVE  Protein / creatinine ratio, urine     Status: Abnormal   Collection Time: 02/09/22  3:25 AM  Result Value Ref Range   Creatinine, Urine 140.08 mg/dL   Total Protein, Urine 31 mg/dL   Protein Creatinine Ratio 0.22 (H) 0.00 - 0.15 mg/mg[Cre]  CBC     Status: Abnormal   Collection Time: 02/09/22  3:40 AM  Result Value Ref Range   WBC 11.3 (H) 4.0 - 10.5 K/uL   RBC 4.17 3.87 - 5.11 MIL/uL   Hemoglobin 13.1 12.0 - 15.0 g/dL   HCT 50.2 77.4 - 12.8 %   MCV 91.6 80.0 - 100.0 fL   MCH 31.4 26.0 - 34.0 pg   MCHC 34.3 30.0 - 36.0 g/dL   RDW 78.6 76.7 - 20.9 %   Platelets 145 (L) 150 - 400 K/uL   nRBC 0.0 0.0 - 0.2 %   Comprehensive metabolic panel     Status: Abnormal   Collection Time: 02/09/22  3:40 AM  Result Value Ref Range   Sodium 136 135 - 145 mmol/L   Potassium 4.2 3.5 - 5.1 mmol/L   Chloride 110 98 - 111 mmol/L   CO2 19 (L) 22 - 32 mmol/L   Glucose, Bld 84 70 - 99 mg/dL   BUN 12 6 - 20 mg/dL   Creatinine, Ser 4.70 0.44 - 1.00 mg/dL   Calcium 9.1 8.9 - 96.2 mg/dL  Total Protein 6.3 (L) 6.5 - 8.1 g/dL   Albumin 2.9 (L) 3.5 - 5.0 g/dL   AST 120 (H) 15 - 41 U/L   ALT 124 (H) 0 - 44 U/L   Alkaline Phosphatase 115 38 - 126 U/L   Total Bilirubin 0.7 0.3 - 1.2 mg/dL   GFR, Estimated >60 >60 mL/min   Anion gap 7 5 - 15  Type and screen  [facility]     Status: None (Preliminary result)   Collection Time: 02/09/22  3:40 AM  Result Value Ref Range   ABO/RH(D) PENDING    Antibody Screen PENDING    Sample Expiration      02/12/2022,2359 Performed at  Hospital Lab, [address] [place], [place] [address]     O/Positive/-- (12/12 1614)  Imaging:  US ABDOMEN LIMITED RUQ (LIVER/GB)  Result Date: 02/09/2022 CLINICAL DATA:  Right upper quadrant pain EXAM: ULTRASOUND ABDOMEN LIMITED RIGHT UPPER QUADRANT COMPARISON:  None Available. FINDINGS: Gallbladder: No gallstones or wall thickening visualized. No sonographic Murphy sign noted by sonographer. Common bile duct: Diameter: 2 mm Liver: No focal lesion identified. Within normal limits in parenchymal echogenicity. Portal vein is patent on color Doppler imaging with normal direction of blood flow towards the liver. IMPRESSION: Negative right upper quadrant ultrasound. Electronically Signed   By: Jonathan  Watts M.D.   On: 02/09/2022 04:53     MAU Course/MDM: I have reviewed the triage vital signs and the nursing notes.   Pertinent labs & imaging results that were available during my care of the patient were reviewed by me and considered in my medical decision making (see chart for details).      I have reviewed her medical  records including past results, notes and treatments.   I have ordered labs and reviewed results. Results are remarkable for Thrombocytopenia and elevated Transaminases.  Protein/Creat Ratio is WNL NST reviewed, reassuring Consult Dr Pickens with presentation, exam findings and test results.  Treatments in MAU included US, Dilaudid, EFM.    Assessment: Single IUP at [redacted]w[redacted]d Severe RUQ pain Chronic Hypertension with exacerbation of severe features HELLP syndrome  Plan: Admit to Labor and Delivery / OR Plan for repeat C/S due to HELLP and remote from delivery MD to follow.  Casanova Schurman CNM, MSN Certified Nurse-Midwife 02/09/2022 3:11 AM 

## 2022-02-09 NOTE — Transfer of Care (Signed)
Immediate Anesthesia Transfer of Care Note  Patient: Diane Huber  Procedure(s) Performed: CESAREAN SECTION  Patient Location: PACU  Anesthesia Type:Spinal  Level of Consciousness: awake, alert  and oriented  Airway & Oxygen Therapy: Patient Spontanous Breathing  Post-op Assessment: Report given to RN and Post -op Vital signs reviewed and stable  Post vital signs: Reviewed and stable  Last Vitals:  Vitals Value Taken Time  BP 106/69 02/09/22 0750  Temp    Pulse 69 02/09/22 0757  Resp 20 02/09/22 0757  SpO2 100 % 02/09/22 0757  Vitals shown include unvalidated device data.  Last Pain:  Vitals:   02/09/22 0552  TempSrc: Oral         Complications: No notable events documented.

## 2022-02-09 NOTE — Lactation Note (Signed)
This note was copied from a baby's chart. Lactation Consultation Note  Patient Name: Diane Huber [id number] Date: 02/09/2022 Reason for consult: Initial assessment;Infant < 6lbs;Late-preterm 34-36.6wks Age:39 hours  Visited with mom of 8 hours old LPI NICU female, she's a P3 and experienced breastfeeding. Assisted with flange fitting, let her know that the size # 21 is temporary and she'll most likely need a # 24 in the next few days. Diane Huber has already taken baby to breast but reported that she didn't suck much, she already started formula supplementation; baby took 8 ml on her first feeding. Reviewed normal LPI behavior, feeding cues, pumping schedule, LPI handout and feeding plan.  Maternal Data Has patient been taught Hand Expression?: Yes Does the patient have breastfeeding experience prior to this delivery?: Yes How long did the patient breastfeed?: 1st baby for one year and second one for 8-12 months  Feeding Mother's Current Feeding Choice: Breast Milk and Formula (due to LPI status)  Lactation Tools Discussed/Used Tools: Pump;Supplemental Nutrition System;Flanges Flange Size: 21;24 (L breast is hypoplastic and needs a different flange size, both breasts are spaced out) Breast pump type: Double-Electric Breast Pump Pump Education: Setup, frequency, and cleaning;Milk Storage Reason for Pumping: LPI < 5 lbs Pumping frequency: q 3 hours after feedings at the breast Pumped volume:  (drops)  Interventions Interventions: Breast feeding basics reviewed;DEBP;Education;LC Services brochure;LPT handout/interventions  Plan of care Encouraged mom to continue putting baby to breast on feeding cues She'll start pumping every 3 hours after feedings at the breast Parents will continue supplementing baby every 3 hours according to LPI handout  FOB present. All questions and concerns answered, family to contact [facility] services PRN.  Discharge WIC Program: Yes Sent to [facility]  Consult Status Consult Status: Follow-up Date: 02/09/22 Follow-up type: In-patient   Gabryel Talamo Venetia Constable 02/09/2022, 2:21 PM
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
## 2022-02-09 NOTE — Op Note (Signed)
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 *History of prior cesarean section  POST-OP DIAGNOSIS: Same   PROCEDURE: Repeat low transverse cesarean section via pfannenstiel skin incision with double layer uterine closure  SURGEON: Surgeon(s) and Role:    [place] Bing, MD - Primary  ASSISTANT: None  ANESTHESIA: spinal  ESTIMATED BLOOD LOSS:   DRAINS: per anesthesia note  TOTAL IV FLUIDS: per anesthesia note  VTE PROPHYLAXIS: SCDs to bilateral lower extremities  ANTIBIOTICS: Two grams of Cefazolin were given., within 1 hour of skin incision  SPECIMENS: placenta to pathology  COMPLICATIONS: none  FINDINGS: No intra-abdominal adhesions were noted. Grossly normal uterus, tubes and ovaries. Clear amniotic fluid, cephalic, female infant, weight 2170gm, APGARs 8/9, intact placenta.  PROCEDURE IN DETAIL: The patient was taken to the operating room where anesthesia was administered and normal fetal heart tones were confirmed. She was then prepped and draped in the normal fashion in the dorsal supine position with a leftward tilt.  After a time out was performed, a pfannensteil skin incision was made with the scalpel and carried through to the underlying layer of fascia. The fascia was then incised at the midline and this incision was extended laterally with the mayo scissors. Attention was turned to the superior aspect of the fascial incision which was grasped with the kocher clamps x 2, tented up and the rectus muscles were dissected off with the scalpel. In a similar fashion the inferior aspect of the fascial incision was grasped with the kocher clamps, tented up and the rectus muscles dissected off with the mayo scissors. The rectus muscles were then separated in the midline and the peritoneum was entered bluntly. The bladder blade was inserted and the vesicouterine peritoneum was identified, tented up  and entered with the metzenbaum scissors. This incision was extended laterally and the bladder flap was created digitally. The bladder blade was reinserted.  A low transverse hysterotomy was made with the scalpel until the endometrial cavity was breached and the amniotic sac ruptured with the Allis clamp, yielding clear amniotic fluid. This incision was extended bluntly and the infant's head, shoulders and body were delivered atraumatically.The cord was clamped x 2 and cut, and the infant was handed to the awaiting pediatricians, after delayed cord clamping was done.  The placenta was then gradually expressed from the uterus and then the uterus was exteriorized and cleared of all clots and debris. The hysterotomy was repaired with a running suture of 1-0 vicryl. A second imbricating layer of 1-0 vicryl suture was then placed to achieve excellent hemostasis.   The uterus and adnexa were then returned to the abdomen, and the hysterotomy and all operative sites were reinspected and excellent hemostasis was noted after irrigation and suction of the abdomen with warm saline.  The peritoneum was closed with a running stitch of 3-0 Vicryl. The fascia was reapproximated with 0 monocryl in a simple running fashion. The subcutaneous layer was then reapproximated with interrupted sutures of 2-0 plain gut, and the skin was then closed with 4-0 monocryl, in a subcuticular fashion.  The patient  tolerated the procedure well. Sponge, lap, needle, and instrument counts were correct x 2. The patient was transferred to the recovery room awake, alert and breathing independently in stable condition.  Cornelia Copa MD Attending Center for [facility])

## 2022-02-09 NOTE — MAU Provider Note (Addendum)
Chief Complaint:  Hypertension and Abdominal Pain   Event Date/Time   First Provider Initiated Contact with Patient 02/09/22 0311    HPI: Diane Huber is a 41 y.o. [id number] at 25w2dwho presents to maternity admissions reporting onset of severe RUQ pain yesterday which worsened tonight. .States that is how she presented in 2013 when she had HELLP syndrome   Chart review showed she presented with RUQ pain, had very elevated Transaminases and low platelets  Delivered by C/S.  She reports good fetal movement, denies LOF, vaginal bleeding, vaginal itching/burning, urinary symptoms, h/a, dizziness, n/v, diarrhea, constipation or fever/chills.  She denies headache, visual changes.  Hypertension This is a chronic problem. The problem has been gradually worsening since onset. Pertinent negatives include no blurred vision or headaches. There are no associated agents to hypertension. Treatments tried: Took Labetalol at 10pm. There are no compliance problems.   Abdominal Pain This is a new problem. The current episode started yesterday. The problem occurs constantly. The problem has been gradually worsening. The pain is located in the RUQ. The abdominal pain does not radiate. Pertinent negatives include no constipation, diarrhea, dysuria, fever or headaches. Nothing aggravates the pain. The pain is relieved by Nothing. She has tried nothing for the symptoms.    RN Note: Diane Huber is a 41 y.o. at [redacted]w[redacted]d here in MAU reporting: Epigastric pain that began yesterday and got worse throughout the night, reports she took her bp at home and had a reading of 192/110. Took labetelol at 10:30pm. Denies ctx, vaginal bleeding or leaking of fluid. +FM   Pain score: 8/10   Past Medical History: Past Medical History:  Diagnosis Date   Essential hypertension 01/20/2021   History of gestational hypertension 07/31/2021    Past obstetric history: OB History  Gravida Para Term Preterm AB Living  5 2 1 1 2 2    SAB IAB Ectopic Multiple Live Births  1 1 0 0 2    # Outcome Date GA Lbr Len/2nd Weight Sex Delivery Anes PTL Lv  5 Current           4 Preterm 11/05/11 [redacted]w[redacted]d  2440 g M CS-LTranv Gen  LIV  3 Term 05/08/03     Vag-Spont   LIV  2 SAB           1 IAB             Past Surgical History: Past Surgical History:  Procedure Laterality Date   CESAREAN SECTION  11/05/2011   Procedure: CESAREAN SECTION;  Surgeon: 11/07/2011, MD;  Location: [facility] ORS;  Service: Gynecology;  Laterality: N/A;  Primary cesarean section of baby boy  at 13 APGAR 4/8 cord ph 7.33    Family History: Family History  Problem Relation Age of Onset   Diabetes Paternal Grandmother    Anesthesia problems Neg Hx    Malignant hyperthermia Neg Hx    Hypotension Neg Hx    Pseudochol deficiency Neg Hx     Social History: Social History   Tobacco Use   Smoking status: Never   Smokeless tobacco: Never  Vaping Use   Vaping Use: Never used  Substance Use Topics   Alcohol use: No   Drug use: No    Allergies: No Known Allergies  Meds:  Medications Prior to Admission  Medication Sig Dispense Refill Last Dose   aspirin EC 81 MG tablet Take 1 tablet (81 mg total) by mouth daily. Swallow whole. 100 tablet 3  labetalol (NORMODYNE) 300 MG tablet Take 1 tablet (300 mg total) by mouth 2 (two) times daily. 60 tablet 5    Prenatal Vit-Fe Fumarate-FA (PRENATAL MULTIVITAMIN) TABS tablet Take 1 tablet by mouth daily at 12 noon.       I have reviewed patient's Past Medical Hx, Surgical Hx, Family Hx, Social Hx, medications and allergies.   ROS:  Review of Systems  Constitutional:  Negative for fever.  Eyes:  Negative for blurred vision.  Gastrointestinal:  Positive for abdominal pain. Negative for constipation and diarrhea.  Genitourinary:  Negative for dysuria.  Neurological:  Negative for headaches.  Other systems negative  Physical Exam  Patient Vitals for the past 24 hrs:  BP Temp Temp src Pulse Resp SpO2   02/09/22 0305 -- -- -- -- -- 100 %  02/09/22 0301 (!) 165/96 97.8 F (36.6 C) Oral 82 19 --   Constitutional: Well-developed, well-nourished female in no acute distress.  Cardiovascular: normal rate and rhythm Respiratory: normal effort, clear to auscultation bilaterally GI: Abd soft, very tender over upper abdomen (epigastric and RUQ), gravid appropriate for gestational age.   No rebound or guarding. MS: Extremities nontender, no edema, normal ROM Neurologic: Alert and oriented x 4.  GU: Neg CVAT.  PELVIC EXAM:   Long and closed  FHT:  Baseline 140 , moderate variability, accelerations present, no decelerations Contractions: Occasional    Labs: Results for orders placed or performed during the hospital encounter of 02/09/22 (from the past 24 hour(s))  Urinalysis, Routine w reflex microscopic Urine, Clean Catch     Status: Abnormal   Collection Time: 02/09/22  3:25 AM  Result Value Ref Range   Color, Urine YELLOW YELLOW   APPearance HAZY (A) CLEAR   Specific Gravity, Urine 1.018 1.005 - 1.030   pH 6.0 5.0 - 8.0   Glucose, UA NEGATIVE NEGATIVE mg/dL   Hgb urine dipstick NEGATIVE NEGATIVE   Bilirubin Urine NEGATIVE NEGATIVE   Ketones, ur NEGATIVE NEGATIVE mg/dL   Protein, ur NEGATIVE NEGATIVE mg/dL   Nitrite NEGATIVE NEGATIVE   Leukocytes,Ua NEGATIVE NEGATIVE  Protein / creatinine ratio, urine     Status: Abnormal   Collection Time: 02/09/22  3:25 AM  Result Value Ref Range   Creatinine, Urine 140.08 mg/dL   Total Protein, Urine 31 mg/dL   Protein Creatinine Ratio 0.22 (H) 0.00 - 0.15 mg/mg[Cre]  CBC     Status: Abnormal   Collection Time: 02/09/22  3:40 AM  Result Value Ref Range   WBC 11.3 (H) 4.0 - 10.5 K/uL   RBC 4.17 3.87 - 5.11 MIL/uL   Hemoglobin 13.1 12.0 - 15.0 g/dL   HCT 50.2 77.4 - 12.8 %   MCV 91.6 80.0 - 100.0 fL   MCH 31.4 26.0 - 34.0 pg   MCHC 34.3 30.0 - 36.0 g/dL   RDW 78.6 76.7 - 20.9 %   Platelets 145 (L) 150 - 400 K/uL   nRBC 0.0 0.0 - 0.2 %   Comprehensive metabolic panel     Status: Abnormal   Collection Time: 02/09/22  3:40 AM  Result Value Ref Range   Sodium 136 135 - 145 mmol/L   Potassium 4.2 3.5 - 5.1 mmol/L   Chloride 110 98 - 111 mmol/L   CO2 19 (L) 22 - 32 mmol/L   Glucose, Bld 84 70 - 99 mg/dL   BUN 12 6 - 20 mg/dL   Creatinine, Ser 4.70 0.44 - 1.00 mg/dL   Calcium 9.1 8.9 - 96.2 mg/dL  Total Protein 6.3 (L) 6.5 - 8.1 g/dL   Albumin 2.9 (L) 3.5 - 5.0 g/dL   AST 161120 (H) 15 - 41 U/L   ALT 124 (H) 0 - 44 U/L   Alkaline Phosphatase 115 38 - 126 U/L   Total Bilirubin 0.7 0.3 - 1.2 mg/dL   GFR, Estimated >09>60 >60>60 mL/min   Anion gap 7 5 - 15  Type and screen [facility]     Status: None (Preliminary result)   Collection Time: 02/09/22  3:40 AM  Result Value Ref Range   ABO/RH(D) PENDING    Antibody Screen PENDING    Sample Expiration      02/12/2022,2359 Performed at [facility], [address] [place], [place] [address]     O/Positive/-- [phone](12/12 1614)  Imaging:  US ABDOMEN LIMITED RUQ (LIVER/GB)  Result Date: 02/09/2022 CLINICAL DATA:  Right upper quadrant pain EXAM: ULTRASOUND ABDOMEN LIMITED RIGHT UPPER QUADRANT COMPARISON:  None Available. FINDINGS: Gallbladder: No gallstones or wall thickening visualized. No sonographic Murphy sign noted by sonographer. Common bile duct: Diameter: 2 mm Liver: No focal lesion identified. Within normal limits in parenchymal echogenicity. Portal vein is patent on color Doppler imaging with normal direction of blood flow towards the liver. IMPRESSION: Negative right upper quadrant ultrasound. Electronically Signed   By: Tiburcio PeaJonathan  Watts M.D.   On: 02/09/2022 04:53     MAU Course/MDM: I have reviewed the triage vital signs and the nursing notes.   Pertinent labs & imaging results that were available during my care of the patient were reviewed by me and considered in my medical decision making (see chart for details).      I have reviewed her medical  records including past results, notes and treatments.   I have ordered labs and reviewed results. Results are remarkable for Thrombocytopenia and elevated Transaminases.  Protein/Creat Ratio is WNL NST reviewed, reassuring Consult Dr Vergie LivingPickens with presentation, exam findings and test results.  Treatments in MAU included US, Dilaudid, EFM.    Assessment: Single IUP at 1362w2d Severe RUQ pain Chronic Hypertension with exacerbation of severe features HELLP syndrome  Plan: Admit to Labor and Delivery / OR Plan for repeat C/S due to HELLP and remote from delivery MD to follow.  Wynelle BourgeoisMarie Coni Homesley CNM, MSN Certified Nurse-Midwife 02/09/2022 3:11 AM

## 2022-02-09 NOTE — Anesthesia Procedure Notes (Signed)
Spinal  Patient location during procedure: OR Start time: 02/09/2022 6:25 AM End time: 02/09/2022 6:30 AM Reason for block: surgical anesthesia Staffing Performed: anesthesiologist  Anesthesiologist: Marcene Duos, MD Preanesthetic Checklist Completed: patient identified, IV checked, site marked, risks and benefits discussed, surgical consent, monitors and equipment checked, pre-op evaluation and timeout performed Spinal Block Patient position: sitting Prep: DuraPrep Patient monitoring: heart rate, cardiac monitor, continuous pulse ox and blood pressure Approach: midline Location: L4-5 Injection technique: single-shot Needle Needle type: Pencan  Needle gauge: 24 G Needle length: 9 cm Assessment Sensory level: T4 Events: CSF return

## 2022-02-10 ENCOUNTER — Ambulatory Visit: Payer: Self-pay

## 2022-02-10 ENCOUNTER — Encounter: Payer: Self-pay | Admitting: Certified Nurse Midwife

## 2022-02-10 LAB — COMPREHENSIVE METABOLIC PANEL
ALT: 112 U/L — ABNORMAL HIGH (ref 0–44)
AST: 64 U/L — ABNORMAL HIGH (ref 15–41)
Albumin: 2.4 g/dL — ABNORMAL LOW (ref 3.5–5.0)
Alkaline Phosphatase: 81 U/L (ref 38–126)
Anion gap: 6 (ref 5–15)
BUN: 11 mg/dL (ref 6–20)
CO2: 20 mmol/L — ABNORMAL LOW (ref 22–32)
Calcium: 6.5 mg/dL — ABNORMAL LOW (ref 8.9–10.3)
Chloride: 102 mmol/L (ref 98–111)
Creatinine, Ser: 0.6 mg/dL (ref 0.44–1.00)
GFR, Estimated: >60 mL/min (ref >60)
Glucose, Bld: 125 mg/dL — ABNORMAL HIGH (ref 70–99)
Potassium: 3.7 mmol/L (ref 3.5–5.1)
Sodium: 128 mmol/L — ABNORMAL LOW (ref 135–145)
Total Bilirubin: 0.2 mg/dL — ABNORMAL LOW (ref 0.3–1.2)
Total Protein: 5.1 g/dL — ABNORMAL LOW (ref 6.5–8.1)

## 2022-02-10 LAB — CBC
HCT: 25.6 % — ABNORMAL LOW (ref 36.0–46.0)
Hemoglobin: 9 g/dL — ABNORMAL LOW (ref 12.0–15.0)
MCH: 31.9 pg (ref 26.0–34.0)
MCHC: 35.2 g/dL (ref 30.0–36.0)
MCV: 90.8 fL (ref 80.0–100.0)
Platelets: 115 10*3/uL — ABNORMAL LOW (ref 150–400)
RBC: 2.82 MIL/uL — ABNORMAL LOW (ref 3.87–5.11)
RDW: 14.8 % (ref 11.5–15.5)
WBC: 11.1 10*3/uL — ABNORMAL HIGH (ref 4.0–10.5)
nRBC: 0 % (ref 0.0–0.2)
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
## 2022-02-10 NOTE — Lactation Note (Signed)
This note was copied from a baby's chart. Lactation Consultation Note  Patient Name: Diane Huber JOINO'M Date: 02/10/2022   Age:39 hours  LC visit attempted, but LCSW came in for SW assessment. LC to return.   Lurline Hare [facility] 02/10/2022, 12:06 PM

## 2022-02-10 NOTE — Lactation Note (Addendum)
This note was copied from a baby's chart. Lactation Consultation Note  Patient Name: Diane Huber OEUMP'N Date: 02/10/2022 Reason for consult: Follow-up assessment;Infant < 6lbs;Late-preterm 34-36.6wks Age:39 hours  Mom says that overnight baby latched well, but soon fell asleep. Mom has not pumped in about 16 hrs. Importance of pumping was discussed. Mom reports that with her 1st baby (born at 62 weeks), her milk came to volume immediately. With her 2nd baby (born at 15 weeks), it took 10 days for her milk to come to volume, but she still did not have enough to exclusively breastfeed.   Mom has [place]; a [facility] referral form was sent.   Infant had been provided the Nfant Slow Flow nipple, but was then given the yellow Similac slow-flow nipple. Mom says infant did better with the Nfant Slow Flow nipple, as sometimes baby would "choke" with the yellow slow-flow nipple. I provided Mom with another Nfant Slow Flow nipple and notified M. Robina Ade, SLP & Dr. Ovid Curd (to request an order be placed).   Mom's questions were answered to her satisfaction.   Maternal Data Does the patient have breastfeeding experience prior to this delivery?: Yes How long did the patient breastfeed?: 1 yr with 1st child; 7-8 months with 2nd child  Feeding Mother's Current Feeding Choice: Breast Milk and Formula Nipple Type: Slow - flow    Lactation Tools Discussed/Used Breast pump type: Double-Electric Breast Pump Reason for Pumping: LPI Pumping frequency: Mom last pumped at 2100 on 5/25. I encouraged Mom to pump whenever infant receives a bottle or q3h. Pumped volume: 0 mL  Interventions Interventions: Education  Discharge Pump: Manual (Manual pump from DEBP kit will be her pump for home use) WIC Program: Yes   Matthias Hughs [facility] 02/10/2022, 1:27 PM

## 2022-02-10 NOTE — Progress Notes (Deleted)
Patient spoke to RN about wanting to hold baby to help with temperatures. RN reiterated need to place baby in crib or with support person (if he is awake), when patient falls asleep.  Raelyn Ensign, RN

## 2022-02-10 NOTE — Progress Notes (Signed)
Subjective: Postpartum Day 1: Cesarean Delivery secondary to [employer] and HELLP Patient has no complaints this morning. Denies HA or visual changes. Pain controlled. Tolerating diet.   Objective: Vital signs in last 24 hours: Temp:  [97.6 F (36.4 C)-98.1 F (36.7 C)] 97.6 F (36.4 C) (05/26 0801) Pulse Rate:  [79-87] 79 (05/26 0801) Resp:  [16-18] 16 (05/26 0801) BP: (104-129)/(60-76) 128/74 (05/26 0801) SpO2:  [95 %-100 %] 99 % (05/26 0801)  Physical Exam:  General: alert Lochia: appropriate Uterine Fundus: firm Incision: healing well DVT Evaluation: No evidence of DVT seen on physical exam.  Recent Labs    02/09/22 1651 02/10/22 0130  HGB 10.7* 9.0*  HCT 30.0* 25.6*    Assessment/Plan: Status post Cesarean section. Doing well postoperatively.  S/P magnesium. BP stable without meds. Will continue to monitor BP. LFT's decreasing  Continue current care.  Diane Huber 02/10/2022, 9:44 AM

## 2022-02-10 NOTE — Congregational Nurse Program (Signed)
Patient spoke to RN about wanting to hold baby to help with temperatures. RN reiterated need to place baby in crib or with support person (if he is awake), when patient is resting. RN encouraged patient not to sleep with baby in the bed with her. Raelyn Ensign, RN

## 2022-02-11 MED ORDER — FUROSEMIDE 20 MG PO TABS
20.0000 mg | ORAL_TABLET | Freq: Two times a day (BID) | ORAL | Status: DC
  Administered 2022-02-11 – 2022-02-12 (×3): 20 mg via ORAL
  Filled 2022-02-11 (×3): qty 1

## 2022-02-11 MED ORDER — NIFEDIPINE ER OSMOTIC RELEASE 30 MG PO TB24
30.0000 mg | ORAL_TABLET | Freq: Every day | ORAL | Status: DC
  Administered 2022-02-11: 30 mg via ORAL
  Filled 2022-02-11: qty 1

## 2022-02-11 MED ORDER — LABETALOL HCL 200 MG PO TABS
300.0000 mg | ORAL_TABLET | Freq: Two times a day (BID) | ORAL | Status: DC
  Administered 2022-02-11 – 2022-02-12 (×3): 300 mg via ORAL
  Filled 2022-02-11 (×3): qty 1

## 2022-02-11 NOTE — Discharge Summary (Signed)
Postpartum Discharge Summary  Date of Service updated***     Patient Name: Diane Huber DOB: [birth year] MRN: [id number]  Date of admission: 02/09/2022 Delivery date:02/09/2022  Delivering provider: Aletha Halim  Date of discharge: 02/11/2022  Admitting diagnosis: HELLP (hemolytic anemia/elev liver enzymes/low platelets in pregnancy) [O14.20] Intrauterine pregnancy: [redacted]w[redacted]d     Secondary diagnosis:  Principal Problem:   HELLP (hemolytic anemia/elev liver enzymes/low platelets in pregnancy)  Additional problems: Iron deficiency anemia   Discharge diagnosis: Preterm Pregnancy Delivered and HELLP Syndrome                                               Post partum procedures: None Augmentation: N/A Complications: None  Hospital course: Sceduled C/S   41 y.o. yo [id number] at [redacted]w[redacted]d was admitted to the hospital 02/09/2022 for scheduled cesarean section with the following indication: HELLP Syndrome .Delivery details are as follows:   Delivery Method:C-Section, Low Transverse  Details of operation can be found in separate operative note.  Patient had an uncomplicated postpartum course. She was on Magnesium for 24 hours. Her blood pressures were well controlled by the time of discharge on labetalol. ***  She is ambulating, tolerating a regular diet, passing flatus, and urinating well. Patient is discharged home in stable condition on  02/11/22        Newborn Data: Birth date:02/09/2022  Birth time:6:53 AM  Gender:Female  Living status:Living  Apgars:8 ,9  Weight:2170 g     Magnesium Sulfate received: Yes: Seizure prophylaxis BMZ received: No Rhophylac:N/A MMR:N/A T-DaP:Given prenatally Flu: N/A Transfusion:No  Physical exam  Vitals:   02/10/22 1923 02/10/22 2345 02/11/22 0001 02/11/22 0443  BP: (!) 152/77 (!) 168/89 (!) 148/78 (!) 159/75  Pulse: 93 82 69 72  Resp: $Remo'16 17  18  'TAsuI$ Temp: 98.2 F (36.8 C) 98.1 F (36.7 C)  98 F (36.7 C)  TempSrc: Oral Oral  Oral  SpO2:  100% 100%  100%  Weight:      Height:       General: alert, cooperative, and no distress Lochia: appropriate Uterine Fundus: firm Incision: Healing well with no significant drainage DVT Evaluation: No evidence of DVT seen on physical exam. Labs: Lab Results  Component Value Date   WBC 11.1 (H) 02/10/2022   HGB 9.0 (L) 02/10/2022   HCT 25.6 (L) 02/10/2022   MCV 90.8 02/10/2022   PLT 115 (L) 02/10/2022      Latest Ref Rng & Units 02/10/2022    1:30 AM  CMP  Glucose 70 - 99 mg/dL 125    BUN 6 - 20 mg/dL 11    Creatinine 0.44 - 1.00 mg/dL 0.60    Sodium 135 - 145 mmol/L 128    Potassium 3.5 - 5.1 mmol/L 3.7    Chloride 98 - 111 mmol/L 102    CO2 22 - 32 mmol/L 20    Calcium 8.9 - 10.3 mg/dL 6.5    Total Protein 6.5 - 8.1 g/dL 5.1    Total Bilirubin 0.3 - 1.2 mg/dL 0.2    Alkaline Phos 38 - 126 U/L 81    AST 15 - 41 U/L 64    ALT 0 - 44 U/L 112     Edinburgh Score:    02/10/2022    9:00 AM  Edinburgh Postnatal Depression Scale Screening Tool  I have been able to  laugh and see the funny side of things. 0  I have looked forward with enjoyment to things. 0  I have blamed myself unnecessarily when things went wrong. 0  I have been anxious or worried for no good reason. 2  I have felt scared or panicky for no good reason. 0  Things have been getting on top of me. 0  I have been so unhappy that I have had difficulty sleeping. 0  I have felt sad or miserable. 0  I have been so unhappy that I have been crying. 0  The thought of harming myself has occurred to me. 0  Edinburgh Postnatal Depression Scale Total 2     After visit meds:  Allergies as of 02/11/2022   No Known Allergies   Med Rec must be completed prior to using this [facility]***        Discharge home in stable condition Infant Feeding: Breast Infant Disposition:{CHL IP OB HOME WITH [id number]} Discharge instruction: per After Visit Summary and Postpartum booklet. Activity: Advance as tolerated.  Pelvic rest for 6 weeks.  Diet: low salt diet Future Appointments: Future Appointments  Date Time Provider [place]  02/20/2022  4:15 PM Aletha Halim, MD [facility]   Follow up Visit:  [place] [facility] [place] at [facility] Follow up in 1 week(s).   Specialty: Obstetrics and Gynecology Why: The office will be in contact for your appointment in one week for blood pressure and incision check. Contact information: [address] [place] [address] [phone]               Message sent to [facility] to schedule appointment.   Please schedule this patient for a In person postpartum visit in 4 weeks with the following provider: Any provider. Additional Postpartum F/U:Incision check 1 week and BP check 1 week  High risk pregnancy complicated by: HTN Delivery mode:  C-Section, Low Transverse  Anticipated Birth Control:  Unsure   02/11/2022 Radene Gunning, MD

## 2022-02-11 NOTE — Progress Notes (Signed)
Subjective: Postpartum Day 2: Cesarean Delivery secondary to [facility] and HELLP Patient has no complaints this morning. Denies HA or visual changes. Pain controlled. Tolerating diet.   Objective: Vital signs in last 24 hours: Temp:  [97.6 F (36.4 C)-98.2 F (36.8 C)] 98 F (36.7 C) (05/27 0443) Pulse Rate:  [69-97] 72 (05/27 0443) Resp:  [16-18] 18 (05/27 0443) BP: (128-168)/(69-89) 159/75 (05/27 0443) SpO2:  [99 %-100 %] 100 % (05/27 0443)  Physical Exam:  General: alert Lochia: appropriate Uterine Fundus: firm Incision: healing well DVT Evaluation: No evidence of DVT seen on physical exam.  Recent Labs    02/09/22 1651 02/10/22 0130  HGB 10.7* 9.0*  HCT 30.0* 25.6*    Assessment/Plan: Status post Cesarean section. Doing well postoperatively.  S/P magnesium. Will continue to monitor BP. LFT's decreasing. Will start Labetalol for blood pressure (although got Procardia 30 XL overnight because this is what she already has and she cannot afford her medications through [facility] (she has tried previously).   Iron deficiency anemia preexisting prior to delivery now worsened with blood loss from surgery. Will start PO iron.   Possible d/c this afternoon pending blood pressure control and discharge of baby.   Milas Hock 02/11/2022, 7:37 AM

## 2022-02-11 NOTE — Lactation Note (Signed)
This note was copied from a baby's chart. Lactation Consultation Note  Patient Name: Girl Adalia Pettis [id number] Date: 02/11/2022 Reason for consult: Follow-up assessment;Late-preterm 34-36.6wks;Breastfeeding assistance Age:39 hours  In-house interpreter [place] used.   LC entered the room and baby was asleep next to mom. Per mom baby has been bottle feeding. Mom states that she was told not to latch baby because it would cause him to burn calories.   Mom states that she has not been pumping due to too many people entering the room.   LC spoke with mom about making sure to aim for 8+ times per day with pumping. LC let mom know that stimulation is important for milk production. She states that she will pump after she feeds baby.   Maternal Data    Feeding Mother's Current Feeding Choice: Breast Milk and Formula Nipple Type: Dr. Levert Feinstein Preemie  LATCH Score                    Lactation Tools Discussed/Used    Interventions Interventions: Education  Discharge    Consult Status Consult Status: Follow-up Date: 02/12/22 Follow-up type: In-patient    Delene Loll 02/11/2022, 12:36 PM

## 2022-02-12 MED ORDER — FUROSEMIDE 20 MG PO TABS: 20.0000 mg | ORAL_TABLET | Freq: Two times a day (BID) | ORAL | 0 refills | Status: DC

## 2022-02-12 MED ORDER — IBUPROFEN 600 MG PO TABS: 600.0000 mg | ORAL_TABLET | Freq: Four times a day (QID) | ORAL | 0 refills | Status: DC | PRN

## 2022-02-12 MED ORDER — OXYCODONE HCL 5 MG PO TABS: 5.0000 mg | ORAL_TABLET | ORAL | 0 refills | Status: DC | PRN

## 2022-02-12 NOTE — Lactation Note (Signed)
This note was copied from a baby's chart. Lactation Consultation Note  Patient Name: Diane Huber [id number] Date: 02/12/2022 Reason for consult: Follow-up assessment;Late-preterm 34-36.6wks;Infant < 6lbs;Breastfeeding assistance Age:39 hours  P3, Late-preterm, Female Infant  Spanish interpreter [place] used.   LC entered the room and dad was holding baby. Per mom she felt that pumping was going better. She stated that she saw some milk when pumping during 2 pumping sessions yesterday. LC encouraged mom to pump 8+ times in 24 hours. LC reviewed engorgement, warning signs, and outpatient services.   Mom states that she has no further questions or concerns.  Feeding Mother's Current Feeding Choice: Breast Milk and Formula   Interventions Interventions: Breast feeding basics reviewed;Education  Discharge Discharge Education: Engorgement and breast care;Warning signs for feeding baby;Outpatient recommendation  Consult Status Consult Status: Complete Date: 02/12/22 Follow-up type: Call as needed    Lysbeth Penner 02/12/2022, 11:38 AM

## 2022-02-14 LAB — SURGICAL PATHOLOGY

## 2022-02-17 ENCOUNTER — Ambulatory Visit: Payer: Self-pay

## 2022-02-17 ENCOUNTER — Other Ambulatory Visit: Payer: Self-pay

## 2022-02-17 ENCOUNTER — Telehealth (HOSPITAL_COMMUNITY): Payer: Self-pay | Admitting: *Deleted

## 2022-02-17 NOTE — Telephone Encounter (Signed)
Left phone voicemail message.  Odis Hollingshead, RN 02-17-2022 at 2:30pm

## 2022-02-20 ENCOUNTER — Ambulatory Visit (INDEPENDENT_AMBULATORY_CARE_PROVIDER_SITE_OTHER): Payer: Self-pay | Admitting: *Deleted

## 2022-02-20 ENCOUNTER — Encounter: Payer: Self-pay | Admitting: Obstetrics and Gynecology

## 2022-02-20 VITALS — BP 135/87 | HR 75 | Temp 98.5°F | Ht 59.0 in | Wt 144.2 lb

## 2022-02-20 DIAGNOSIS — Z4889 Encounter for other specified surgical aftercare: Secondary | ICD-10-CM

## 2022-02-20 DIAGNOSIS — Z0130 Encounter for examination of blood pressure without abnormal findings: Secondary | ICD-10-CM

## 2022-02-20 NOTE — Progress Notes (Addendum)
Here for Nurse visit for bp check and incision check s/p repeat C/S 02/09/22 , also had HELLP. States has not taken labetolol today because in a rush to get here. Denies edema or headaches. BP today 135/87. Advised to continue taking labetolol as prescribed . Patient states not taking oxycodone because not having pain and doesn't need it.  Wound CDI with steristrips with old bloody drainage noted and one area of green on steristrips. States dressing came off yesterday. Steristrips removed. Wound remains CDI without redness, edema , discharge. Patient denies pain or discomfort when area around incision palpated. Temperature 98.5 Discussed findings with Dr.Arnold , no new orders. Reviewed wound care with patient and postpartum appointment. She voices understanding.  [name]

## 2022-02-22 ENCOUNTER — Telehealth: Payer: Self-pay | Admitting: *Deleted

## 2022-02-22 NOTE — Telephone Encounter (Signed)
I called Diane Huber with [employer], Interpreter to follow up with her. I asked how her incision is doing. I asked if she had any opening, redness, discharge, increased tenderness, fever, etc. She denies any issues and states it is fine. She is aware to call if issues. [name]

## 2022-02-24 ENCOUNTER — Other Ambulatory Visit: Payer: Self-pay

## 2022-02-24 ENCOUNTER — Ambulatory Visit: Payer: Self-pay

## 2022-03-03 ENCOUNTER — Other Ambulatory Visit: Payer: Self-pay

## 2022-03-27 ENCOUNTER — Ambulatory Visit (INDEPENDENT_AMBULATORY_CARE_PROVIDER_SITE_OTHER): Payer: Self-pay | Admitting: Obstetrics and Gynecology

## 2022-03-27 ENCOUNTER — Other Ambulatory Visit: Payer: Self-pay

## 2022-03-27 ENCOUNTER — Encounter: Payer: Self-pay | Admitting: Obstetrics and Gynecology

## 2022-03-27 VITALS — BP 128/89 | HR 77 | Ht 59.0 in | Wt 140.3 lb

## 2022-03-27 DIAGNOSIS — D696 Thrombocytopenia, unspecified: Secondary | ICD-10-CM

## 2022-03-27 DIAGNOSIS — R7401 Elevation of levels of liver transaminase levels: Secondary | ICD-10-CM

## 2022-03-27 NOTE — Progress Notes (Signed)
Spanish [employer] Interpreter

## 2022-03-27 NOTE — Progress Notes (Signed)
    Post Partum Visit Note  Diane Huber is a 41 y.o. [id number] s/p 5/25 rLTCS  at 35/2 for HELLP, superimposed on CHTN, and remote from delivery. .  Anesthesia: spinal. Postpartum course has been uncomplicated. Baby is doing well. Baby is feeding by both breast and bottle - [place] Good Start. Bleeding no bleeding. Bowel function is normal. Bladder function is normal. Patient is not sexually active. Contraception method is none. Postpartum depression screening: negative.   The pregnancy intention screening data noted above was reviewed. Potential methods of contraception were discussed. The patient elected to proceed with No data recorded.   Edinburgh Postnatal Depression Scale - 03/27/22 1422       Edinburgh Postnatal Depression Scale:  In the Past 7 Days   I have been able to laugh and see the funny side of things. 0    I have looked forward with enjoyment to things. 0    I have blamed myself unnecessarily when things went wrong. 0    I have been anxious or worried for no good reason. 0    I have felt scared or panicky for no good reason. 0    Things have been getting on top of me. 0    I have been so unhappy that I have had difficulty sleeping. 0    I have felt sad or miserable. 0    I have been so unhappy that I have been crying. 0    The thought of harming myself has occurred to me. 0    Edinburgh Postnatal Depression Scale Total 0             Review of Systems Pertinent items noted in HPI and remainder of comprehensive ROS otherwise negative.  Objective:  BP 128/89   Pulse 77   Ht 4\' 11"  (1.499 m)   Wt 140 lb 4.8 oz (63.6 kg)   LMP 06/07/2021 (Exact Date)   Breastfeeding Yes Comment: And Bottle  BMI 28.34 kg/m    NAD Abdomen: soft, nttp, nd, c/d/I inicision, no hernia or masses  Assessment:   Normal postpartum exam.   Plan:   Essential components of care per ACOG recommendations: *PP: routine care. Pt is self pay and desires to follow up at the HD  for birth control. - Last pap smear  Diagnosis  Date Value Ref Range Status  08/29/2021   Final   - Negative for intraepithelial lesion or malignancy (NILM)   *HELLP superimposed on Chronic HTN: pt on labetalol 300 bid; she forgot to take it today. Recommend continuing on it for now and reassess in two weeks with BP check. Pt on no meds prior to pregnancy but she was pretty sick with SBPs in the 190s at home and 160s in the MAU when she delivered so it may take longer for her to get off meds, if she's able to.  Recheck labs today, cbc and cmp  IPad interpreter used  RTC 2wks for BP check  14/08/2021, MD [facility] [place] [facility], [facility]

## 2022-03-28 LAB — CBC
Hematocrit: 38.3 % (ref 34.0–46.6)
Hemoglobin: 13.1 g/dL (ref 11.1–15.9)
MCH: 31.7 pg (ref 26.6–33.0)
MCHC: 34.2 g/dL (ref 31.5–35.7)
MCV: 93 fL (ref 79–97)
Platelets: 254 10*3/uL (ref 150–450)
RBC: 4.13 x10E6/uL (ref 3.77–5.28)
RDW: 13.8 % (ref 11.7–15.4)
WBC: 4.6 10*3/uL (ref 3.4–10.8)

## 2022-03-28 LAB — COMPREHENSIVE METABOLIC PANEL
ALT: 85 IU/L — ABNORMAL HIGH (ref 0–32)
AST: 51 IU/L — ABNORMAL HIGH (ref 0–40)
Albumin/Globulin Ratio: 1.8 (ref 1.2–2.2)
Albumin: 4.7 g/dL (ref 3.9–4.9)
Alkaline Phosphatase: 85 IU/L (ref 44–121)
BUN/Creatinine Ratio: 22 (ref 9–23)
BUN: 11 mg/dL (ref 6–20)
Bilirubin Total: 0.3 mg/dL (ref 0.0–1.2)
CO2: 25 mmol/L (ref 20–29)
Calcium: 9.9 mg/dL (ref 8.7–10.2)
Chloride: 102 mmol/L (ref 96–106)
Creatinine, Ser: 0.51 mg/dL — ABNORMAL LOW (ref 0.57–1.00)
Globulin, Total: 2.6 g/dL (ref 1.5–4.5)
Glucose: 85 mg/dL (ref 70–99)
Potassium: 4.3 mmol/L (ref 3.5–5.2)
Sodium: 139 mmol/L (ref 134–144)
Total Protein: 7.3 g/dL (ref 6.0–8.5)
eGFR: 122 mL/min/{1.73_m2} (ref >59)

## 2022-03-28 NOTE — Addendum Note (Signed)
Addended by: [place] Bing on: 03/28/2022 11:54 PM   Modules accepted: Orders

## 2022-03-29 ENCOUNTER — Telehealth: Payer: Self-pay

## 2022-03-29 NOTE — Telephone Encounter (Addendum)
-----   Message from [place] Bing, MD sent at 03/28/2022 11:54 PM EDT ----- Nurses, please let her know that her liver numbers are improved but not all the way back to normal yet, so I want to re-check it when she comes back on 7/24 for a nurses visit and hopefully it's back to normal then; her cbc is normal  Front desk, can you make her 7/24 visit a bp check and a lab visit. Thanks  Pt informed with Spanish Interpreter Eda R., results and the request for f/u lab at her visit on 04/10/22.  Pt verbalized understanding with no further questions.   Diane Huber  03/29/22

## 2022-04-10 ENCOUNTER — Ambulatory Visit (INDEPENDENT_AMBULATORY_CARE_PROVIDER_SITE_OTHER): Payer: Self-pay | Admitting: General Practice

## 2022-04-10 ENCOUNTER — Other Ambulatory Visit: Payer: Self-pay

## 2022-04-10 VITALS — BP 138/86 | HR 79 | Ht 59.0 in | Wt 141.0 lb

## 2022-04-10 DIAGNOSIS — Z0130 Encounter for examination of blood pressure without abnormal findings: Secondary | ICD-10-CM

## 2022-04-10 DIAGNOSIS — R7401 Elevation of levels of liver transaminase levels: Secondary | ICD-10-CM

## 2022-04-10 DIAGNOSIS — Z8759 Personal history of other complications of pregnancy, childbirth and the puerperium: Secondary | ICD-10-CM

## 2022-04-10 NOTE — Progress Notes (Signed)
Patient presents to office today for BP check following up from pp visit on 7/10. Patient's history is significant for HELLP superimposed on Chronic HTN- delivered via c-section on 5/25. She denies headaches, dizziness or blurry vision since last visit although she reports she cannot see far away since delivery. No edema noted today. She reports taking labetalol 300mg  BID daily- just took her AM dose 45 minutes ago. BP 141/89 & 138/86. CMP collected today for follow up. Discussed with patient scheduling an appt with [facility] for follow up and management of blood pressures. Patient verbalized understanding.   [facility] RN BSN 04/10/22

## 2022-04-11 LAB — COMPREHENSIVE METABOLIC PANEL
ALT: 75 IU/L — ABNORMAL HIGH (ref 0–32)
AST: 43 IU/L — ABNORMAL HIGH (ref 0–40)
Albumin/Globulin Ratio: 1.9 (ref 1.2–2.2)
Albumin: 4.7 g/dL (ref 3.9–4.9)
Alkaline Phosphatase: 104 IU/L (ref 44–121)
BUN/Creatinine Ratio: 37 — ABNORMAL HIGH (ref 9–23)
BUN: 17 mg/dL (ref 6–20)
Bilirubin Total: <0.2 mg/dL (ref 0.0–1.2)
CO2: 19 mmol/L — ABNORMAL LOW (ref 20–29)
Calcium: 9.8 mg/dL (ref 8.7–10.2)
Chloride: 103 mmol/L (ref 96–106)
Creatinine, Ser: 0.46 mg/dL — ABNORMAL LOW (ref 0.57–1.00)
Globulin, Total: 2.5 g/dL (ref 1.5–4.5)
Glucose: 124 mg/dL — ABNORMAL HIGH (ref 70–99)
Potassium: 4 mmol/L (ref 3.5–5.2)
Sodium: 139 mmol/L (ref 134–144)
Total Protein: 7.2 g/dL (ref 6.0–8.5)
eGFR: 125 mL/min/{1.73_m2} (ref >59)

## 2022-04-11 NOTE — Addendum Note (Signed)
Addended by: [place] Bing on: 04/11/2022 11:51 AM   Modules accepted: Orders

## 2023-01-08 ENCOUNTER — Ambulatory Visit: Payer: Self-pay | Admitting: *Deleted

## 2023-01-08 NOTE — Telephone Encounter (Signed)
  Chief Complaint: Hypertension Symptoms: BP since Saturday 150-180/105. Headache at times. Was on BP meds after childbirth "But no longer, for months."  Frequency: Saturday Pertinent Negatives: Patient denies weakness, visual changes,dizziness Disposition: ED /[] Urgent Care (no appt availability in office) / Appointment(In office/virtual)/  [place] [facility]/ Home Care/ Refused Recommended Disposition /[] [place] [facility]/  Follow-up with PCP Additional Notes: Appt secure for Wednesday, first available. Advised ED for worsening symptoms, dizziness, increased headache, CP, weakness. Pt verbalizes understanding.  Placed on wait list.  Reason for Disposition  Systolic BP  >= 160 OR Diastolic >= 100  Answer Assessment - Initial Assessment Questions 1. BLOOD PRESSURE: "What is the blood pressure?" "Did you take at least two measurements 5 minutes apart?"     150/105  to 181/ 105    2. ONSET: "When did you take your blood pressure?"     Yesterday 3. HOW: "How did you take your blood pressure?" (e.g., automatic home BP monitor, visiting nurse)     Home monitor 4. HISTORY: "Do you have a history of high blood pressure?"     Yes 5. MEDICINES: "Are you taking any medicines for blood pressure?" "Have you missed any doses recently?"     Has not had for months 6. OTHER SYMPTOMS: "Do you have any symptoms?" (e.g., blurred vision, chest pain, difficulty breathing, headache, weakness)     Headache 7/10, eyes "Tired."  Protocols used: Blood Pressure - High-A-AH

## 2023-01-10 ENCOUNTER — Other Ambulatory Visit: Payer: Self-pay

## 2023-01-10 ENCOUNTER — Ambulatory Visit: Payer: Self-pay | Attending: Physician Assistant | Admitting: Physician Assistant

## 2023-01-10 ENCOUNTER — Encounter: Payer: Self-pay | Admitting: Physician Assistant

## 2023-01-10 VITALS — BP 138/88 | HR 71 | Wt 134.4 lb

## 2023-01-10 DIAGNOSIS — R739 Hyperglycemia, unspecified: Secondary | ICD-10-CM

## 2023-01-10 DIAGNOSIS — I10 Essential (primary) hypertension: Secondary | ICD-10-CM

## 2023-01-10 DIAGNOSIS — Z758 Other problems related to medical facilities and other health care: Secondary | ICD-10-CM

## 2023-01-10 DIAGNOSIS — Z603 Acculturation difficulty: Secondary | ICD-10-CM

## 2023-01-10 MED ORDER — HYDROCHLOROTHIAZIDE 25 MG PO TABS
12.5000 mg | ORAL_TABLET | Freq: Every day | ORAL | 3 refills | Status: DC
  Filled 2023-01-10: qty 45, 90d supply, fill #0

## 2023-01-10 NOTE — Patient Instructions (Addendum)
Goal <130/85  Check blood pressures daily and record and bring to next visit.    Drink 80 to 100 ounces water daily

## 2023-01-10 NOTE — Progress Notes (Signed)
Patient ID: Diane Huber, female   DOB: 11/30/1980, 42 y.o.   MRN: [id number]    Diane Huber, is a 42 y.o. female  [id number]:[id number]  [id number]  DOB - 1981/02/05  Chief Complaint  Patient presents with   Hypertension       Subjective:   Diane Huber is a 42 y.o. female here today to see if she needs to be on BP meds.  She has only ever had to take meds when she was pregnant and did well on labetelol.  She has "felt fine" and not checking BP until she checked it last week. Last week started having HA/dizzy last week.  None now.  BP at home: 148/95 196/113  No CP/SOB  No problems updated.  ALLERGIES: No Known Allergies  PAST MEDICAL HISTORY: Past Medical History:  Diagnosis Date   Essential hypertension 01/20/2021   GBS bacteriuria 08/16/2021   toc neg   History of gestational hypertension 07/31/2021    MEDICATIONS AT HOME: Prior to Admission medications   Medication Sig Start Date End Date Taking? Authorizing Provider  hydrochlorothiazide (HYDRODIURIL) 25 MG tablet Take 0.5 tablets (12.5 mg total) by mouth daily. 01/10/23  Yes Anders Simmonds, PA-C  Prenatal Vit-Fe Fumarate-FA (PRENATAL MULTIVITAMIN) TABS tablet Take 1 tablet by mouth daily at 12 noon. Patient not taking: Reported on 01/10/2023    [provider]    ROS: Neg HEENT Neg resp Neg cardiac Neg GI Neg GU Neg MS Neg psych Neg neuro  Objective:   Vitals:   01/10/23 0848 01/10/23 0907  BP: (!) 147/97 138/88  Pulse: 71   SpO2: 100%   Weight: 134 lb 6.4 oz (61 kg)    Exam General appearance : Awake, alert, not in any distress. Speech Clear. Not toxic looking HEENT: Atraumatic and Normocephalic Neck: Supple, no JVD. No cervical lymphadenopathy.  Chest: Good air entry bilaterally, CTAB.  No rales/rhonchi/wheezing CVS: S1 S2 regular, no murmurs.  Extremities: B/L Lower Ext shows no edema, both legs are warm to touch Neurology: Awake alert, and oriented X 3, CN  II-XII intact, Non focal Skin: No Rash  Data Review Lab Results  Component Value Date   HGBA1C 5.5 08/29/2021   HGBA1C 5.1 01/20/2021   HGBA1C 5.9 (H) 06/21/2020    Assessment & Plan   1. Hyperglycemia I have had a lengthy discussion and provided education about insulin resistance and the intake of too much sugar/refined carbohydrates.  I have advised the patient to work at a goal of eliminating sugary drinks, candy, desserts, sweets, refined sugars, processed foods, and white carbohydrates.  The patient expresses understanding.  - Hemoglobin A1c - Basic metabolic panel  2. Essential hypertension Recheck manually was not nearly as high.   - hydrochlorothiazide (HYDRODIURIL) 25 MG tablet; Take 0.5 tablets (12.5 mg total) by mouth daily.  Dispense: 45 tablet; Refill: 3 - Basic metabolic panel  3. Language barrier [employer] "[place]" interpreters used and additional time performing visit was required.     Return in about 4 weeks (around 02/07/2023) for me or [facility] for blood pressure check.  The patient was given clear instructions to go to ER or return to medical center if symptoms don't improve, worsen or new problems develop. The patient verbalized understanding. The patient was told to call to get lab results if they haven't heard anything in the next week.      [place]ian [place], PA-C [facility], [place] [phone]   01/10/2023, 9:10  AM

## 2023-01-11 LAB — HEMOGLOBIN A1C
Est. average glucose Bld gHb Est-mCnc: 108 mg/dL
Hgb A1c MFr Bld: 5.4 % (ref 4.8–5.6)

## 2023-01-11 LAB — BASIC METABOLIC PANEL
BUN/Creatinine Ratio: 17 (ref 9–23)
BUN: 8 mg/dL (ref 6–20)
CO2: 22 mmol/L (ref 20–29)
Calcium: 9.8 mg/dL (ref 8.7–10.2)
Chloride: 101 mmol/L (ref 96–106)
Creatinine, Ser: 0.48 mg/dL — ABNORMAL LOW (ref 0.57–1.00)
Glucose: 89 mg/dL (ref 70–99)
Potassium: 4.7 mmol/L (ref 3.5–5.2)
Sodium: 138 mmol/L (ref 134–144)
eGFR: 123 mL/min/{1.73_m2} (ref >59)

## 2023-01-12 ENCOUNTER — Encounter: Payer: Self-pay | Admitting: *Deleted

## 2023-02-14 ENCOUNTER — Ambulatory Visit: Payer: Self-pay | Admitting: Physician Assistant

## 2023-04-30 ENCOUNTER — Ambulatory Visit: Payer: Self-pay | Admitting: Nurse Practitioner

## 2024-03-17 ENCOUNTER — Other Ambulatory Visit: Payer: Self-pay | Admitting: Family Medicine

## 2024-03-17 ENCOUNTER — Other Ambulatory Visit: Payer: Self-pay

## 2024-03-17 DIAGNOSIS — I10 Essential (primary) hypertension: Secondary | ICD-10-CM

## 2024-03-17 NOTE — Telephone Encounter (Unsigned)
 Copied from CRM [id number]. Topic: Clinical - Medication Refill >> Mar 17, 2024 12:36 PM Zebedee SAUNDERS wrote: Medication: hydrochlorothiazide  (HYDRODIURIL ) 25 MG tablet    Has the patient contacted their pharmacy? Yes (Agent: If no, request that the patient contact the pharmacy for the refill. If patient does not wish to contact the pharmacy document the reason why and proceed with request.) (Agent: If yes, when and what did the pharmacy advise?)  This is the patient's preferred pharmacy:  [facility] [address] [place] [address] Phone: [phone] Fax: [phone]  Is this the correct pharmacy for this prescription? Yes If no, delete pharmacy and type the correct one.   Has the prescription been filled recently? Yes  Is the patient out of the medication? Yes  Has the patient been seen for an appointment in the last year OR does the patient have an upcoming appointment? Yes  Can we respond through MyChart? Yes  Agent: Please be advised that Rx refills may take up to 3 business days. We ask that you follow-up with your pharmacy.

## 2024-03-19 NOTE — Telephone Encounter (Signed)
 Requested medication (s) are due for refill today:   Yes  Requested medication (s) are on the active medication list:   Yes  Future visit scheduled:   Yes 04/10/2024 with Dr. Brien     LOV 01/10/2023.  Has cancelled and been a No Show for a couple of appts since 01/10/2023.   Last ordered: 01/10/2023 #45, 3 refills  Unable to refill because labs are due and pt has cancelled and No Showed appts.   Provider to review for refills prior to upcoming appt in July.   Requested Prescriptions  Pending Prescriptions Disp Refills   hydrochlorothiazide  (HYDRODIURIL ) 25 MG tablet 45 tablet 3    Sig: Take 0.5 tablets (12.5 mg total) by mouth daily.     Cardiovascular: Diuretics - Thiazide Failed - 03/19/2024 10:27 AM      Failed - Cr in normal range and within 180 days    Creatinine, Ser  Date Value Ref Range Status  01/10/2023 0.48 (L) 0.57 - 1.00 mg/dL Final   Creatinine, Urine  Date Value Ref Range Status  02/09/2022 140.08 mg/dL Final         Failed - K in normal range and within 180 days    Potassium  Date Value Ref Range Status  01/10/2023 4.7 3.5 - 5.2 mmol/L Final         Failed - Na in normal range and within 180 days    Sodium  Date Value Ref Range Status  01/10/2023 138 134 - 144 mmol/L Final         Failed - Valid encounter within last 6 months    Recent Outpatient Visits           1 year ago Hyperglycemia   [facility] [place]. [place] [facility], Jon HERO, [place]   2 years ago Positive pregnancy test   [facility] [facility] [place]. [facility] [place] Clam, [place]   2 years ago Essential hypertension   [place] [facility] [place]. [facility] Delbert Clam, [place]   3 years ago Transient elevated blood pressure    [facility] [place]. [facility] [place], Clam, MD              Passed - Last BP in normal range     BP Readings from Last 1 Encounters:  01/10/23 138/88

## 2024-03-26 ENCOUNTER — Other Ambulatory Visit: Payer: Self-pay

## 2024-04-08 NOTE — Progress Notes (Unsigned)
   Established Patient Office Visit  Subjective   Patient ID: Diane Huber, female    DOB: 12-03-1980  Age: 43 y.o. MRN: [id number]  No chief complaint on file.   HPI  {History (Optional):23778}  ROS    Objective:     There were no vitals taken for this visit. {Vitals History (Optional):23777}  Physical Exam   No results found for any visits on 04/10/24.  {Labs (Optional):23779}  The ASCVD Risk score (Arnett DK, et al., 2019) failed to calculate for the following reasons:   The systolic blood pressure is missing   Cannot find a previous HDL lab   Cannot find a previous total cholesterol lab    Assessment & Plan:   Problem List Items Addressed This Visit   None   No follow-ups on file.    Belvie Silvan, MD

## 2024-04-10 ENCOUNTER — Other Ambulatory Visit: Payer: Self-pay

## 2024-04-10 ENCOUNTER — Ambulatory Visit: Payer: Self-pay | Admitting: Critical Care Medicine

## 2024-04-10 ENCOUNTER — Encounter: Payer: Self-pay | Admitting: Critical Care Medicine

## 2024-04-10 ENCOUNTER — Ambulatory Visit: Payer: Self-pay | Attending: Critical Care Medicine | Admitting: Critical Care Medicine

## 2024-04-10 VITALS — BP 145/95 | HR 70 | Resp 19 | Ht 59.0 in | Wt 150.0 lb

## 2024-04-10 DIAGNOSIS — R682 Dry mouth, unspecified: Secondary | ICD-10-CM

## 2024-04-10 DIAGNOSIS — E782 Mixed hyperlipidemia: Secondary | ICD-10-CM

## 2024-04-10 DIAGNOSIS — I10 Essential (primary) hypertension: Secondary | ICD-10-CM

## 2024-04-10 DIAGNOSIS — O1420 HELLP syndrome (HELLP), unspecified trimester: Secondary | ICD-10-CM

## 2024-04-10 DIAGNOSIS — Z87898 Personal history of other specified conditions: Secondary | ICD-10-CM

## 2024-04-10 MED ORDER — HYDROCHLOROTHIAZIDE 25 MG PO TABS
25.0000 mg | ORAL_TABLET | Freq: Every day | ORAL | 3 refills | Status: AC
  Filled 2024-04-10 (×2): qty 90, 90d supply, fill #0

## 2024-04-10 MED ORDER — HYDROCHLOROTHIAZIDE 25 MG PO TABS
12.5000 mg | ORAL_TABLET | Freq: Every day | ORAL | 3 refills | Status: DC
  Filled 2024-04-10: qty 45, 90d supply, fill #0

## 2024-04-10 NOTE — Assessment & Plan Note (Signed)
 Recommended increasing oral hydration have a bottle of water for her at night when she has a dry mouth

## 2024-04-10 NOTE — Assessment & Plan Note (Signed)
 Hypertension poorly controlled discussed lifestyle changes and began an increased dose of hydrochlorothiazide  at 25 mg daily check labs

## 2024-04-10 NOTE — Patient Instructions (Addendum)
 Increase hydrochlorothiazide  to 1 pill daily  Remember to drink plenty of fluids and have a bottle of water next to you when you are sleeping to drink during the night  Complete set of labs obtained today  Use skin moisturizer  Return 3 weeks to have your blood pressure rechecked  Return to see Dr. Gordan in 4 months  Aumente la hidroclorotiazida a 1 pastilla al da.  Recuerde beber mucho lquido y jeffory query botella de agua a mano al dormir para beber durante la noche.  Complete sus anlisis de laboratorio hoy.  Use crema hidratante.  Regrese en 3 semanas para que le revisen la presin arterial.  Regrese a la consulta del Dr. Newlin 4 meses.

## 2024-04-10 NOTE — Assessment & Plan Note (Signed)
 Resolved check liver function

## 2024-04-10 NOTE — Assessment & Plan Note (Signed)
Reassess labs 

## 2024-04-11 ENCOUNTER — Encounter: Payer: Self-pay | Admitting: Critical Care Medicine

## 2024-04-11 ENCOUNTER — Ambulatory Visit: Payer: Self-pay | Admitting: Critical Care Medicine

## 2024-04-11 DIAGNOSIS — Z8759 Personal history of other complications of pregnancy, childbirth and the puerperium: Secondary | ICD-10-CM

## 2024-04-11 DIAGNOSIS — R7989 Other specified abnormal findings of blood chemistry: Secondary | ICD-10-CM | POA: Insufficient documentation

## 2024-04-11 DIAGNOSIS — R748 Abnormal levels of other serum enzymes: Secondary | ICD-10-CM

## 2024-04-11 LAB — CBC WITH DIFFERENTIAL/PLATELET
Basophils Absolute: 0 x10E3/uL (ref 0.0–0.2)
Basos: 1 %
EOS (ABSOLUTE): 0.1 x10E3/uL (ref 0.0–0.4)
Eos: 2 %
Hematocrit: 43.1 % (ref 34.0–46.6)
Hemoglobin: 13.8 g/dL (ref 11.1–15.9)
Immature Grans (Abs): 0 x10E3/uL (ref 0.0–0.1)
Immature Granulocytes: 0 %
Lymphocytes Absolute: 1.9 x10E3/uL (ref 0.7–3.1)
Lymphs: 28 %
MCH: 30.4 pg (ref 26.6–33.0)
MCHC: 32 g/dL (ref 31.5–35.7)
MCV: 95 fL (ref 79–97)
Monocytes Absolute: 0.7 x10E3/uL (ref 0.1–0.9)
Monocytes: 10 %
Neutrophils Absolute: 4 x10E3/uL (ref 1.4–7.0)
Neutrophils: 59 %
Platelets: 269 x10E3/uL (ref 150–450)
RBC: 4.54 x10E6/uL (ref 3.77–5.28)
RDW: 13.3 % (ref 11.7–15.4)
WBC: 6.9 x10E3/uL (ref 3.4–10.8)

## 2024-04-11 LAB — CMP14+EGFR
ALT: 103 IU/L — ABNORMAL HIGH (ref 0–32)
AST: 59 IU/L — ABNORMAL HIGH (ref 0–40)
Albumin: 4.5 g/dL (ref 3.9–4.9)
Alkaline Phosphatase: 87 IU/L (ref 44–121)
BUN/Creatinine Ratio: 15 (ref 9–23)
BUN: 8 mg/dL (ref 6–24)
Bilirubin Total: 0.2 mg/dL (ref 0.0–1.2)
CO2: 22 mmol/L (ref 20–29)
Calcium: 9.4 mg/dL (ref 8.7–10.2)
Chloride: 101 mmol/L (ref 96–106)
Creatinine, Ser: 0.53 mg/dL — ABNORMAL LOW (ref 0.57–1.00)
Globulin, Total: 2.8 g/dL (ref 1.5–4.5)
Glucose: 89 mg/dL (ref 70–99)
Potassium: 4.4 mmol/L (ref 3.5–5.2)
Sodium: 137 mmol/L (ref 134–144)
Total Protein: 7.3 g/dL (ref 6.0–8.5)
eGFR: 119 mL/min/1.73 (ref >59)

## 2024-04-11 LAB — HEMOGLOBIN A1C
Est. average glucose Bld gHb Est-mCnc: 117 mg/dL
Hgb A1c MFr Bld: 5.7 % — ABNORMAL HIGH (ref 4.8–5.6)

## 2024-04-11 LAB — LIPID PANEL
Chol/HDL Ratio: 5.3 ratio — ABNORMAL HIGH (ref 0.0–4.4)
Cholesterol, Total: 208 mg/dL — ABNORMAL HIGH (ref 100–199)
HDL: 39 mg/dL — ABNORMAL LOW (ref >39)
LDL Chol Calc (NIH): 121 mg/dL — ABNORMAL HIGH (ref 0–99)
Triglycerides: 271 mg/dL — ABNORMAL HIGH (ref 0–149)
VLDL Cholesterol Cal: 48 mg/dL — ABNORMAL HIGH (ref 5–40)

## 2024-04-11 NOTE — Progress Notes (Signed)
 Let patient know liver function is still mildly elevated  , cholesterol is high as well.   I ordered liver ultrasound and return to have hepatitis C labs drawn   no diabetes No medication changes for now

## 2024-04-29 NOTE — Telephone Encounter (Signed)
 Copied from CRM [id number]. Topic: Clinical - Lab/Test Results >> Apr 29, 2024  5:06 PM Tiffini S wrote: Reason for CRM: Patient had a missed call- called back for the lab results/ said the medication she was prescribed causes a headaches and dizziness for hydrochlorothiazide  (HYDRODIURIL ) 25 MG tablet. Patient have stopped taking the medication. Please call the patient back at [phone]  Spanish Interpreter: Tita ID: [id number]

## 2024-05-02 ENCOUNTER — Telehealth: Payer: Self-pay

## 2024-05-02 ENCOUNTER — Ambulatory Visit: Payer: Self-pay

## 2024-05-02 NOTE — Telephone Encounter (Signed)
 error

## 2024-05-02 NOTE — Telephone Encounter (Signed)
 Copied from CRM #[id number]. Topic: Clinical - Medication Question >> May 02, 2024  1:39 PM Sophia H wrote: Reason for CRM: Patient cancelled bp check for today 08/15 due to transportation. Patient has not been taking the bp meds, caused headaches. Wants to know if another medication will be given in place of original and if a new bp check should be scheduled. Please advise.  **Spanish caller

## 2024-05-09 ENCOUNTER — Ambulatory Visit: Payer: Self-pay | Attending: Family Medicine

## 2024-08-20 ENCOUNTER — Telehealth: Payer: Self-pay | Admitting: Nurse Practitioner

## 2024-08-20 NOTE — Telephone Encounter (Signed)
 Pt unconfirmed appt 12/3 (per vr ) lvm

## 2024-08-22 ENCOUNTER — Ambulatory Visit: Payer: Self-pay | Admitting: Nurse Practitioner
